# Patient Record
Sex: Male | Born: 1960 | Race: Black or African American | Hispanic: No | Marital: Married | Smoking: Former smoker
Health system: Southern US, Community
[De-identification: ages and names within clinical notes are randomized; demographics above are authoritative.]

## PROBLEM LIST (undated history)

## (undated) DIAGNOSIS — M75102 Unspecified rotator cuff tear or rupture of left shoulder, not specified as traumatic: Secondary | ICD-10-CM

## (undated) DIAGNOSIS — M1990 Unspecified osteoarthritis, unspecified site: Secondary | ICD-10-CM

## (undated) HISTORY — PX: KNEE ARTHROSCOPY: SUR90

## (undated) HISTORY — PX: OTHER SURGICAL HISTORY: SHX169

---

## 1998-05-19 ENCOUNTER — Emergency Department (HOSPITAL_COMMUNITY): Admission: EM | Admit: 1998-05-19 | Discharge: 1998-05-19 | Payer: Self-pay | Admitting: Emergency Medicine

## 2000-10-26 ENCOUNTER — Emergency Department (HOSPITAL_COMMUNITY): Admission: EM | Admit: 2000-10-26 | Discharge: 2000-10-26 | Payer: Self-pay | Admitting: Emergency Medicine

## 2000-10-26 ENCOUNTER — Encounter: Payer: Self-pay | Admitting: Emergency Medicine

## 2003-11-04 ENCOUNTER — Ambulatory Visit (HOSPITAL_COMMUNITY): Admission: RE | Admit: 2003-11-04 | Discharge: 2003-11-04 | Payer: Self-pay | Admitting: Internal Medicine

## 2003-11-09 ENCOUNTER — Ambulatory Visit (HOSPITAL_COMMUNITY): Admission: RE | Admit: 2003-11-09 | Discharge: 2003-11-09 | Payer: Self-pay | Admitting: Internal Medicine

## 2003-12-13 ENCOUNTER — Ambulatory Visit (HOSPITAL_COMMUNITY): Admission: RE | Admit: 2003-12-13 | Discharge: 2003-12-13 | Payer: Self-pay | Admitting: Internal Medicine

## 2004-12-17 ENCOUNTER — Ambulatory Visit (HOSPITAL_COMMUNITY): Admission: RE | Admit: 2004-12-17 | Discharge: 2004-12-17 | Payer: Self-pay | Admitting: Internal Medicine

## 2005-01-09 ENCOUNTER — Ambulatory Visit: Payer: Self-pay | Admitting: Orthopedic Surgery

## 2005-01-21 ENCOUNTER — Ambulatory Visit: Payer: Self-pay | Admitting: Orthopedic Surgery

## 2005-02-01 ENCOUNTER — Ambulatory Visit: Payer: Self-pay | Admitting: Orthopedic Surgery

## 2005-02-02 ENCOUNTER — Inpatient Hospital Stay (HOSPITAL_COMMUNITY): Admission: RE | Admit: 2005-02-02 | Discharge: 2005-02-03 | Payer: Self-pay | Admitting: Orthopedic Surgery

## 2005-02-02 ENCOUNTER — Encounter: Payer: Self-pay | Admitting: Orthopedic Surgery

## 2005-02-07 ENCOUNTER — Ambulatory Visit: Payer: Self-pay | Admitting: Orthopedic Surgery

## 2005-02-07 ENCOUNTER — Ambulatory Visit (HOSPITAL_COMMUNITY): Admission: RE | Admit: 2005-02-07 | Discharge: 2005-02-07 | Payer: Self-pay | Admitting: Urology

## 2005-02-11 ENCOUNTER — Ambulatory Visit: Payer: Self-pay | Admitting: Orthopedic Surgery

## 2005-02-19 ENCOUNTER — Encounter (HOSPITAL_COMMUNITY): Admission: RE | Admit: 2005-02-19 | Discharge: 2005-03-02 | Payer: Self-pay | Admitting: Orthopedic Surgery

## 2005-02-25 ENCOUNTER — Ambulatory Visit: Payer: Self-pay | Admitting: Orthopedic Surgery

## 2005-03-04 ENCOUNTER — Encounter (HOSPITAL_COMMUNITY): Admission: RE | Admit: 2005-03-04 | Discharge: 2005-04-03 | Payer: Self-pay | Admitting: Orthopedic Surgery

## 2005-03-27 ENCOUNTER — Ambulatory Visit: Payer: Self-pay | Admitting: Orthopedic Surgery

## 2005-04-09 ENCOUNTER — Encounter (HOSPITAL_COMMUNITY): Admission: RE | Admit: 2005-04-09 | Discharge: 2005-05-09 | Payer: Self-pay | Admitting: Orthopedic Surgery

## 2005-05-02 ENCOUNTER — Ambulatory Visit: Payer: Self-pay | Admitting: Orthopedic Surgery

## 2005-06-13 ENCOUNTER — Ambulatory Visit: Payer: Self-pay | Admitting: Orthopedic Surgery

## 2005-08-22 ENCOUNTER — Ambulatory Visit: Payer: Self-pay | Admitting: Orthopedic Surgery

## 2005-11-04 ENCOUNTER — Emergency Department (HOSPITAL_COMMUNITY): Admission: EM | Admit: 2005-11-04 | Discharge: 2005-11-04 | Payer: Self-pay | Admitting: Emergency Medicine

## 2005-12-16 ENCOUNTER — Ambulatory Visit: Payer: Self-pay | Admitting: Gastroenterology

## 2005-12-27 ENCOUNTER — Ambulatory Visit: Payer: Self-pay | Admitting: Gastroenterology

## 2006-01-22 ENCOUNTER — Ambulatory Visit: Payer: Self-pay | Admitting: Gastroenterology

## 2006-03-06 ENCOUNTER — Ambulatory Visit: Payer: Self-pay | Admitting: Orthopedic Surgery

## 2006-06-15 ENCOUNTER — Emergency Department (HOSPITAL_COMMUNITY): Admission: EM | Admit: 2006-06-15 | Discharge: 2006-06-15 | Payer: Self-pay | Admitting: Emergency Medicine

## 2006-08-04 ENCOUNTER — Ambulatory Visit: Payer: Self-pay | Admitting: Orthopedic Surgery

## 2007-08-13 ENCOUNTER — Encounter: Payer: Self-pay | Admitting: Orthopedic Surgery

## 2007-08-19 ENCOUNTER — Ambulatory Visit: Payer: Self-pay | Admitting: Orthopedic Surgery

## 2007-08-19 DIAGNOSIS — M1710 Unilateral primary osteoarthritis, unspecified knee: Secondary | ICD-10-CM

## 2007-08-28 ENCOUNTER — Ambulatory Visit (HOSPITAL_COMMUNITY): Admission: RE | Admit: 2007-08-28 | Discharge: 2007-08-28 | Payer: Self-pay | Admitting: Urology

## 2007-09-21 ENCOUNTER — Telehealth: Payer: Self-pay | Admitting: Orthopedic Surgery

## 2007-09-21 ENCOUNTER — Ambulatory Visit (HOSPITAL_COMMUNITY): Admission: RE | Admit: 2007-09-21 | Discharge: 2007-09-21 | Payer: Self-pay | Admitting: Urology

## 2007-09-22 ENCOUNTER — Encounter (INDEPENDENT_AMBULATORY_CARE_PROVIDER_SITE_OTHER): Payer: Self-pay | Admitting: Urology

## 2007-09-22 ENCOUNTER — Ambulatory Visit (HOSPITAL_COMMUNITY): Admission: RE | Admit: 2007-09-22 | Discharge: 2007-09-22 | Payer: Self-pay | Admitting: Urology

## 2008-06-06 ENCOUNTER — Encounter: Payer: Self-pay | Admitting: Orthopedic Surgery

## 2009-06-28 ENCOUNTER — Ambulatory Visit: Payer: Self-pay | Admitting: Orthopedic Surgery

## 2009-11-27 ENCOUNTER — Telehealth: Payer: Self-pay | Admitting: Orthopedic Surgery

## 2009-12-21 ENCOUNTER — Ambulatory Visit: Payer: Self-pay | Admitting: Orthopedic Surgery

## 2009-12-25 ENCOUNTER — Emergency Department (HOSPITAL_COMMUNITY): Admission: EM | Admit: 2009-12-25 | Discharge: 2009-12-25 | Payer: Self-pay | Admitting: Emergency Medicine

## 2009-12-28 ENCOUNTER — Ambulatory Visit: Payer: Self-pay | Admitting: Otolaryngology

## 2010-02-21 ENCOUNTER — Emergency Department (HOSPITAL_COMMUNITY): Admission: EM | Admit: 2010-02-21 | Discharge: 2010-02-21 | Payer: Self-pay | Admitting: Emergency Medicine

## 2010-05-10 ENCOUNTER — Ambulatory Visit: Payer: Self-pay | Admitting: Orthopedic Surgery

## 2010-07-03 NOTE — Progress Notes (Signed)
Summary: Rx question  Phone Note Call from Patient   Caller: Patient Summary of Call: Patient came in to make his annual July appt. Asking about switching to [place] in [place] for next refill.  Scheduled for 12/21/09. Please advise patient about new written Rx or what is needed.  Home PH *** [phone] / Cell, avail before work shift 2:30pm [phone] Initial call taken by: Cammie Sickle,  November 27, 2009 1:15 PM  Follow-up for Phone Call        that should be fine Follow-up by: Ether Griffins,  November 27, 2009 1:23 PM  Additional Follow-up for Phone Call Additional follow up Details #1::        He was asking if he may have a new Rx written prior to appt in July? Additional Follow-up by: Cammie Sickle,  November 27, 2009 1:51 PM    Additional Follow-up for Phone Call Additional follow up Details #2::    no on 11/01/09 he received number 60 with 5 refills, this is enough for 60 days Follow-up by: Ether Griffins,  November 27, 2009 1:57 PM  Additional Follow-up for Phone Call Additional follow up Details #3:: Details for Additional Follow-up Action Taken: Called patient and left voice mail message for patient to return call.   **As of 11/29/09 4:40pm, no return call back from patient. Additional Follow-up by: Cammie Sickle,  November 27, 2009 2:03 PM

## 2010-07-03 NOTE — Assessment & Plan Note (Signed)
Summary: LT KNEE RE-CK/XRAYS/[facility]/CAF   Visit Type:  Follow-up  CC:  bilateral knee .  History of Present Illness: 50 year old male status post high tibial osteotomy the LEFT knee about 5 years ago continues to have pain in both knees.  He declined injection last visit and his LEFT knee  Complains of bilateral knee pain today RIGHT greater than LEFT with small joint effusion  Current medications are Aleve and Vicodin for pain Xrays today of both knees.  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 no drainage, denies fever    Allergies: No Known Drug Allergies  Physical Exam  Msk:  fairly normal body habitus slightly overweight appearance normal Pulses:  normal perfusion in both lower extremities Extremities:  fortunately has full range of motion in terms of flexion in both knees has a slight flexion contracture the LEFT knee has some crepitance in the patellofemoral joint has medial compartment tenderness and when he stands he has significant varus in both knees Neurologic:  The coordination and sensation were normal  The reflexes were normal   Skin:  intact without lesions or rashes Psych:  alert and cooperative; normal mood and affect; normal attention span and concentration   Impression & Recommendations:  Problem # 1:  LOWER LEG, ARTHRITIS, DEGEN./OSTEO (ICD-715.96)  bilateral knee films bilateral varus osteoarthritis with bone-on-bone changes there is a plate in the LEFT tibia from a high tibial osteotomy  We injected the RIGHT knee joint after aspirating 20 cc of clear yellow fluid there were some cartilage particles in it  inject right  Verbal consent was obtained. The knee was prepped with alcohol and ethyl chloride. 1 cc of depomedrol 40mg /cc and 4 cc of lidocaine 1% was injected. there were no complications.   His updated medication list for this problem includes:    Vicodin 5-500 Mg Tabs  (Hydrocodone-acetaminophen) .Marland Kitchen... 1 by mouth q 4 as needed pain  Orders: Est. Patient Level III (16109) Knee x-ray,  3 views (60454) Joint Aspirate / Injection, Large (20610) Depo- Medrol 40mg  (J1030)  Patient Instructions: 1)  Continue vicodin and aleve  2)  You have received an injection of cortisone today. You may experience increased pain at the injection site. Apply ice pack to the area for 20 minutes every 2 hours and take 2 xtra strength tylenol every 8 hours. This increased pain will usually resolve in 24 hours. The injection will take effect in 3-10 days.  3)  return in 6 months for knee films  Prescriptions: VICODIN 5-500 MG TABS (HYDROCODONE-ACETAMINOPHEN) 1 by mouth q 4 as needed pain  #90 x 5   Entered and Authorized by:   [name] MD   Signed by:   [name] MD on 12/21/2009   Method used:   Print then Give to Patient   RxID:   [id number] VICODIN 5/500 one tablet every 4 hrs as needed pain  #60 x 2   Entered and Authorized by:   [name] MD   Signed by:   [name] MD on 12/21/2009   Method used:   Print then Give to Patient   RxID:   [id number]

## 2010-07-03 NOTE — Assessment & Plan Note (Signed)
Summary: med refill/[employer] ins/bsf   Visit Type:  Follow-up  CC:  bilateral knee pain.  History of Present Illness: Mr. Fread had a LEFT proximal tibial osteotomy in September of 2006 for varus knee with pain.  He is now 50 years old.  He takes Vicodin as needed for medial knee pain.  His major problem is pain when the weather is cold and has been quite cold lately.  He did report some stiffness but no injuries.  He wears a knee brace on his LEFT knee as needed and he signed have some pain in the RIGHT knee as well.  He denies catching locking or giving way.  He does note that his activity level has decreased and has put on some weight.  review of systems continued he has put on weight as stated no fever chills or fatigue, no shortness of breath, no feelings of instability or feet related to the knees, denies unsteady gait numbness or tingling.       Allergies (verified): No Known Drug Allergies  Past History:  Past Medical History: Last updated: 08/18/2007 none  Family History: no major medical problems related to his family history  Social History: Patient is married.   Physical Exam  Additional Exam:  Gen. appearance was normal he is slightly overweight.  He is oriented x3  His mood and affect are present  His gait is normal his station is varus in both knees which is moderate.  The RIGHT and LEFT lower extremities exhibited varus malalignment medial joint line tenderness no knee effusions  Range of motion is 120 with a slight loss of extension on the LEFT normal on the RIGHT.  Both knees are stable and exhibit normal strength.  The LEFT knee skin incision is curvilinear nontender without redness the skin on the RIGHT knee is normal  Cardiovascular exam is normal in terms of pulse and temperature without edema in his lower extremities and there is no lymphadenopathy in either groin sensation is normal in both knees reflexes are normal in both knees and ankles  his coordination and balance are normal     Impression & Recommendations:  Problem # 1:  LOWER LEG, ARTHRITIS, DEGEN./OSTEO (ICD-715.96) Assessment Deteriorated  3 views are taken of both knees  Plate and screw construct seen on the LEFT knee with varus malalignment joint space narrowing severe  Impression LEFT knee status post proximal tibial osteotomy with internal fixation and severe medial joint space narrowing  RIGHT knee shows severe medial joint space narrowing, varus deformity, osteophytes  Impression osteoarthritis RIGHT knee severe  Assessment I would say that the LEFT knee proximal tibial osteotomy was successful up at least 4-1/2 years.  He has put on some extra weight which will affect his knees.  But for now I think we can manage him with an injection but he declined.  He is having some numbness and tingling in his hands which we have recommended vitamin B6 twice a day 100 mg for presumed carpal tunnel.  He will take Aleve or Advil once in the morning once at night and continue his Vicodin as needed.  I expect he will improve when the weather turns better.  His updated medication list for this problem includes:    Vicodin 5-500 Mg Tabs (Hydrocodone-acetaminophen) .Marland Kitchen... 1 by mouth q 4 as needed pain  Orders: Est. Patient Level IV (16109) Knee x-ray,  3 views (60454)  Medications Added to Medication List This Visit: 1)  Vicodin 5-500 Mg Tabs (Hydrocodone-acetaminophen) .Marland KitchenMarland KitchenMarland Kitchen  1 by mouth q 4 as needed pain  Patient Instructions: 1)  Take aleve or advil once in the morning and once at night and vicodin as needed  2)  return in 6 months for bilateral x-rays knee  3)  VITAMIN B 6 100 MG two times a day X 6 WEEKS  Prescriptions: VICODIN 5-500 MG TABS (HYDROCODONE-ACETAMINOPHEN) 1 by mouth q 4 as needed pain  #60 x 5   Entered and Authorized by:   [name] MD   Signed by:   [name] MD on 06/28/2009   Method used:   Print then Give to Patient   RxID:    [id number]

## 2010-08-16 LAB — URINALYSIS, ROUTINE W REFLEX MICROSCOPIC
Bilirubin Urine: NEGATIVE
Ketones, ur: NEGATIVE mg/dL
Protein, ur: NEGATIVE mg/dL
pH: 6 (ref 5.0–8.0)

## 2010-08-18 LAB — COMPREHENSIVE METABOLIC PANEL
AST: 20 U/L (ref 0–37)
Albumin: 3.9 g/dL (ref 3.5–5.2)
Alkaline Phosphatase: 54 U/L (ref 39–117)
BUN: 13 mg/dL (ref 6–23)
CO2: 27 mEq/L (ref 19–32)
Creatinine, Ser: 0.72 mg/dL (ref 0.4–1.5)
GFR calc Af Amer: >60 mL/min (ref >60)
GFR calc non Af Amer: >60 mL/min (ref >60)
Sodium: 137 mEq/L (ref 135–145)
Total Bilirubin: 1.5 mg/dL — ABNORMAL HIGH (ref 0.3–1.2)
Total Protein: 7.3 g/dL (ref 6.0–8.3)

## 2010-08-18 LAB — DIFFERENTIAL
Basophils Absolute: 0 10*3/uL (ref 0.0–0.1)
Basophils Relative: 0 % (ref 0–1)
Lymphocytes Relative: 19 % (ref 12–46)
Lymphs Abs: 1.7 10*3/uL (ref 0.7–4.0)
Monocytes Absolute: 0.7 10*3/uL (ref 0.1–1.0)
Neutro Abs: 6.5 10*3/uL (ref 1.7–7.7)
Neutrophils Relative %: 73 % (ref 43–77)

## 2010-08-18 LAB — CBC
MCH: 23.1 pg — ABNORMAL LOW (ref 26.0–34.0)
MCHC: 31.9 g/dL (ref 30.0–36.0)

## 2010-09-06 ENCOUNTER — Other Ambulatory Visit: Payer: Self-pay | Admitting: *Deleted

## 2010-09-06 DIAGNOSIS — M25569 Pain in unspecified knee: Secondary | ICD-10-CM

## 2010-09-06 MED ORDER — HYDROCODONE-ACETAMINOPHEN 5-500 MG PO TABS: 1.0000 | ORAL_TABLET | ORAL | Status: DC | PRN

## 2010-09-17 ENCOUNTER — Encounter: Payer: Self-pay | Admitting: Orthopedic Surgery

## 2010-09-19 ENCOUNTER — Ambulatory Visit (INDEPENDENT_AMBULATORY_CARE_PROVIDER_SITE_OTHER): Payer: BC Managed Care – PPO | Admitting: Orthopedic Surgery

## 2010-09-19 DIAGNOSIS — S43206A Unspecified dislocation of unspecified sternoclavicular joint, initial encounter: Secondary | ICD-10-CM

## 2010-09-19 DIAGNOSIS — S2329XA Dislocation of other parts of thorax, initial encounter: Secondary | ICD-10-CM

## 2010-09-19 NOTE — Patient Instructions (Signed)
You have received a steroid shot. 15% of patients experience increased pain at the injection site with in the next 24 hours. This is best treated with ice and tylenol extra strength 2 tabs every 8 hours. If you are still having pain please call the office.    

## 2010-10-04 ENCOUNTER — Other Ambulatory Visit: Payer: Self-pay | Admitting: *Deleted

## 2010-10-04 ENCOUNTER — Encounter: Payer: Self-pay | Admitting: Orthopedic Surgery

## 2010-10-04 ENCOUNTER — Telehealth: Payer: Self-pay | Admitting: Radiology

## 2010-10-04 DIAGNOSIS — M25569 Pain in unspecified knee: Secondary | ICD-10-CM

## 2010-10-04 MED ORDER — HYDROCODONE-ACETAMINOPHEN 5-500 MG PO TABS: 1.0000 | ORAL_TABLET | ORAL | Status: DC | PRN

## 2010-10-04 NOTE — Progress Notes (Signed)
Followup visit for chronic knee pain and arthritis status post tibial osteotomy just with increasing pain  X-rays taken today injection will be given today as well  Secondary complaint review of systems complains of subluxation of his collarbone as it attaches to the sternum is a sternoclavicular anterior or posterior subluxation dislocation.  Tenderness noted over the LEFT sternoclavicular joint.  There is also pain with palpation there is definite motion there.  He complains of difficulty swallowing when he is lying down  We injected his operative knee  X-rays were taken shows varus deformity osteoarthritis  Plan CT scan a.c. joint

## 2010-10-04 NOTE — Telephone Encounter (Signed)
Read above ???? 

## 2010-10-05 NOTE — Telephone Encounter (Signed)
They would not approve his CT scan. They wanted you to call and do a peer to peer review at [phone]. Opt #2.

## 2010-10-05 NOTE — Telephone Encounter (Signed)
Read above ????

## 2010-10-08 NOTE — Telephone Encounter (Signed)
Forward to Dr. Romeo Apple for response.

## 2010-10-08 NOTE — Telephone Encounter (Signed)
He wants to know what you are going to do to find out what it is? Where does he go from here?

## 2010-10-08 NOTE — Telephone Encounter (Signed)
Tell the patient the study was denied

## 2010-10-09 NOTE — Telephone Encounter (Signed)
I cant do anything about it

## 2010-10-16 NOTE — Op Note (Signed)
NAME:  Henry Wolfe, Henry Wolfe                ACCOUNT NO.:  [id number]   MEDICAL RECORD NO.:  [id number]          PATIENT TYPE:  AMB   LOCATION:  DAY                           FACILITY:  [facility]   PHYSICIAN:  Ky Barban, M.D.DATE OF BIRTH:  04/10/1961   DATE OF PROCEDURE:  DATE OF DISCHARGE:                               OPERATIVE REPORT   PREOPERATIVE DIAGNOSES:  1. Microscopic hematuria.  2. Lipoma, right spermatic cord.   PROCEDURES:  1. Cystoscopy.  2. Biopsy of prostatic urethra and fulguration.  3. Excision of right spermatic cord lipoma.   ANESTHESIA:  General endotracheal.   PROCEDURE:  The patient under general endotracheal anesthesia in  lithotomy position, after usual prep and drape, a #25 cystoscope was  introduced under direct vision.  His meatus was tight.  I had to dilate  him to get the cystoscope in.  The prostatic urethra shows chronic  inflammatory changes especially around the verumontanum, and the bladder  was inspected.  There was no tumor, stone, or foreign body.  Then, using  a flexible biopsy forceps, biopsy around the verumontanum was taken.  Then, this area around the verumontanum was fulgurated with Bugbee  electrode.  These instruments were removed.  The patient was placed in  the supine position, then prepped and draped again.  Lipoma was located  in the upper part of the scrotum near the neck of the scrotum.  It was  held by the assistant, tied, and longitudinal incision about an inch  long is made.  Fascial layer divided with the help of the cautery.  The  lipoma is delivered through this incision.  The capsule around the  lipoma was opened up.  The lipoma came out with blunt dissection  completely.  Then, the inside of the capsule was inspected.  The  bleeders were coagulated.  The capsule along with the fascial layers was  closed with a running stitch of 3-0 Vicryl.  Skin was closed with 4-0  chromic horizontal mattress stitches.  At the end, sterile  gauze  dressing applied.  The patient left the operating room in a satisfactory  condition.      Ky Barban, M.D.  Electronically Signed     MIJ/MEDQ  D:  09/22/2007  T:  09/23/2007  Job:  [id number]

## 2010-10-16 NOTE — H&P (Signed)
NAME:  Henry Wolfe, Henry Wolfe                ACCOUNT NO.:  [id number]   MEDICAL RECORD NO.:  [id number]          PATIENT TYPE:  AMB   LOCATION:  DAY                           FACILITY:  [facility]   PHYSICIAN:  Ky Barban, M.D.DATE OF BIRTH:  Feb 27, 1961   DATE OF ADMISSION:  09/22/2007  DATE OF DISCHARGE:  LH                              HISTORY & PHYSICAL   CHIEF COMPLAINT:  Microscopic hematuria and right hydrocele.   HISTORY:  The patient was referred to me because of persistent  microscopic hematuria.  Our workup in the office with CT scan of abdomen  and pelvis with and without contrast is negative.  Urine cytology is  negative.  He is going to have cystoscopy tomorrow at the time of his  hydrocelectomy.  He was also found to have swelling of his right  hemiscrotum.  He requested that he wanted to go ahead and have that  fixed because it was bothering him.   PAST MEDICAL HISTORY:  1. History of erectile dysfunction in the past.  2. History of having problem with his left knee.  3. No history of diabetes or hypertension.  4. He had microscopic hematuria even when I saw him in 2006, but he      never really had a complete workup.  So, I will do his      hydrocelectomy, right and also cystoscopy tomorrow at the same      time.   REVIEW OF SYSTEMS:  Unremarkable.   FAMILY HISTORY:  Negative.   PERSONAL HISTORY:  He does not smoke or drink.   PHYSICAL EXAMINATION:  Blood pressure 150/80 and temperature is normal.  CENTRAL NERVOUS SYSTEM:  No gross neurological deficit.  HEAD, NECK, EYE, ENT:  Negative.  CHEST:  Symmetrical.  HEART:  Regular sinus rhythm.  No murmur.  ABDOMEN:  Soft and flat.  Liver, spleen, and kidneys are not palpable.  No CVA tenderness.  External genitalia is unremarkable.  RECTAL:  Prostate 1.5+, smooth, and firm.   IMPRESSION:  Microscopic hematuria and right hydrocele.   Of note, examination of his scrotum shows enlarged right scrotum, which  transilluminates.  He is going to have testicular ultrasound today.      Ky Barban, M.D.  Electronically Signed     MIJ/MEDQ  D:  09/21/2007  T:  09/22/2007  Job:  [id number]

## 2010-10-19 NOTE — Op Note (Signed)
NAME:  Henry Wolfe, Henry Wolfe                ACCOUNT NO.:  [id number]   MEDICAL RECORD NO.:  [id number]          PATIENT TYPE:  INP   LOCATION:  A327                          FACILITY:  [facility]   PHYSICIAN:  Vickki Hearing, M.D.DATE OF BIRTH:  [birth year]   DATE OF PROCEDURE:  02/02/2005  DATE OF DISCHARGE:                                 OPERATIVE REPORT   HISTORY:  This is a 50 year old male with medial compartment arthritis and  varus deformity of the knee presented with significant medial knee pain and  some back pain. Back pain was treated with prednisone pack successfully. He  continued to have a medial knee pain. We discussed the possibility of  osteotomy versus unicompartmental arthroplasty as well as total knee  replacement, and a decision was made jointly to go ahead with an osteotomy.  Primary indication was medial knee pain and varus deformity.   PREOPERATIVE DIAGNOSIS:  Gonarthrosis with varus left knee.   POSTOPERATIVE DIAGNOSIS:  Gonarthrosis with varus left knee.   PROCEDURE:  High tibial osteotomy, left knee.   OPERATIVE FINDINGS:  Medial compartment arthrosis with 5 degrees varus  deformity.   SURGEON:  Dr. Romeo Apple.   ANESTHETIC:  General.   DETAILS OF PROCEDURE:  The patient was identified properly as Henry Wolfe.  His left knee was marked as the surgical site and was countersigned by the  surgeon. History and physical were updated, and he was given a gram of  Ancef.   He was taken to the operating room where a spinal anesthetic was attempted  but was unsuccessful, and he was intubated.   We prepped and draped his left leg with a tourniquet on the left thigh. We  took a time-out and completed that as required to confirm the procedure as a  left knee high tibial osteotomy.   With the tourniquet elevated, a curvilinear incision was made over the  lateral portion of the left knee, extended down the anterior portion of the  tibial shaft. The subcutaneous tissue  was divided in a continuous flap down  to the underlying fascia. Fascia was split in line with the skin incision,  and the tibiofibular joint was released with blunt dissection. Once this was  successfully accomplished, the anterior musculature on the tibia was  subperiosteally dissected until the posterior aspect of the tibia was easily  identified.   We then took two K-wires and found the lateral joint just beneath the  lateral meniscus and confirmed this with x-ray. We placed a cutting jig in  the neutral position and confirmed this with x-ray as well. We then took a  drill bit followed by pin to stabilize the transverse cutting jig. Once we  determined the proper slope of the tibial, we drilled and placed another  smooth pin. Once this was successful, we drilled across the tibia to a depth  of 80 mm and then set the saw for 70 mm. We cut transversely across the  tibia 2.5 cm below the joint line at a 70 mm depth. We removed this jig,  placed the angled cutting jig. The patient  had a 4 degree deformity, and we  opted for an 8 degree valgus alignment postoperatively indicating a 12  degree cut. We made our 12 degrees degree cut and removed the bone wedge and  saved it for later bone graft.   We then placed the lateral plate, drilled both screws, and left them  somewhat loose. We drilled our distal hole for the compression clamp and  applied the compression clamp and jig to the distal plate. We then waited  approximately 8 minutes under compression, and the osteotomy did not close  down. We then took the saw and extended the transverse cut slightly medially  and then manually made the osteotomy correction and closed it down. We  applied our distal screws and one screw across the osteotomy site. We added  autogenous bone graft. We let the tourniquet down, controlled the bleeding,  irrigated the wound and closed in layered fashion with loose approximation  of the fascia with 0 Monocryl,  and then subcu was closed with 2-0 Monocryl,  skin with staples. The patient was placed on Ace bandage and CryoCuff as  well as knee immobilizer. He was extubated in recovery room in stable  condition. Counts were correct. No complications. Blood loss was  approximately 150 cc.   POSTOPERATIVE PLAN:  He will be admitted for observation and pain control.  He will be allowed weightbearing as tolerated with two crutches for  approximately six weeks. We will take his staples out at approximately 10  days. We will make a radiographs at 10 days, 6 weeks, and 12 weeks and then  until healing. He can start range of motion exercises with the therapist or  at home after the staples are removed.      Vickki Hearing, M.D.  Electronically Signed     SEH/MEDQ  D:  02/02/2005  T:  02/02/2005  Job:  [id number]

## 2010-10-19 NOTE — H&P (Signed)
NAME:  Henry Wolfe, Henry Wolfe                ACCOUNT NO.:  [id number]   MEDICAL RECORD NO.:  [id number]          PATIENT TYPE:  AMB   LOCATION:  DAY                           FACILITY:  [facility]   PHYSICIAN:  Vickki Hearing, M.D.DATE OF BIRTH:  [birth year]   DATE OF ADMISSION:  DATE OF DISCHARGE:  LH                                HISTORY & PHYSICAL   CHIEF COMPLAINT:  Left knee pain.   HISTORY:  This is a 50 year old male who complains of intermittent,  moderate, dull, aching medial pain in his left knee for approximately the  last 4-5 years.  There are no modifying factors, in terms of relief.  This  is gradual atraumatic onset of pain.  He complains of some swelling.  He has  worn a knee sleeve in the past.  He has had intermittent episodes of  swelling.  We have x-rayed his knee and found a varus alignment with medial  compartment arthrosis.   He indicates that he has experienced some weight gain and chest pain with  poor circulation, cough, nausea, reflux, dizziness, joint swelling, pain,  and mood swings under his system review.  His urinary, endocrine, skin,  ears, nose, and throat, immunologic, and lymphatic systems are normal.   He has no known drug allergies.   He has no medical problems.  No previous surgery.   His only medication right now is Vicodin.   FAMILY HISTORY:  Negative.   FAMILY PHYSICIAN:  Dr. Felecia Shelling.   SOCIAL HISTORY:  He is married.  He is a Location manager.  He does not  smoke, drink, or use caffeinated beverages.   PHYSICAL EXAMINATION:  VITAL SIGNS:  Height 5 foot 8.  Pulse 76 with a  respiratory rate of 16.  GENERAL APPEARANCE:  He has normal development, grooming, hygiene, and  nutrition.  Body habitus medium.  PSYCHOLOGIC:  He is awake, alert and oriented x3 with normal sensation on  neurologic exam.  CARDIOVASCULAR:  Normal pulses.  No venous stasis.  Normal temperature  without edema.  SKIN:  Normal.  EXTREMITIES:  Left knee shows full range of  motion.  Good strength and  muscle tone.  No instability.  He does have tenderness of the medial  compartment with varus alignment of the knee.  He walks with an antalgic  gait.  His upper extremities show normal range of motion, strength,  stability, and alignment.  His right lower extremity is normal.   He has radiographs taken in my office which show medial compartment  __________ arthrosis.   DIAGNOSIS:  Osteoarthritis.   PLAN:  Osteotomy of the knee.  We have reviewed the risks and benefits of  the procedure.  He agrees to proceed.  He has been offered hemiarthroplasty  as well as versus continued nonoperative treatment.  The pain has become  progressive, and he has opted for surgery.  We will do a high tibial  osteotomy.  He knows he will be out of work for three months with three  months of rehabilitation.      Vickki Hearing, M.D.  Electronically  Signed     SEH/MEDQ  D:  01/31/2005  T:  01/31/2005  Job:  [id number]

## 2010-10-19 NOTE — Assessment & Plan Note (Signed)
Henry Wolfe                           GASTROENTEROLOGY OFFICE NOTE   NAME:DAVISJhamari, Markowicz                       MRN:          [id number]  DATE:12/16/2005                            DOB:          August 05, 1960    PROBLEM:  1.  Rectal bleeding.  2.  Dysphagia.   Henry Wolfe is a 50 year old African-American male referred to [place] courtesy of  Dr. Felecia Shelling for evaluation.  On several occasions he has seen blood mixed with  his stool.  He has passed clots at times.  He denies abdominal or rectal  pain but he may have minimal rectal burning.  He also complains of frequent  pyrosis for which he takes Zantac. He has noted dysphagia to solids.  He has  excess belching and flatulence.   PAST MEDICAL HISTORY:  Pertinent  for sleep apnea.  He has chronic headaches  and has arthritis.   FAMILY HISTORY:  Noncontributory.   MEDICATIONS:  Vicodin and ibuprofen. He has no allergies.  He does not  smoke. He drinks occasionally.  He is married and works as an Journalist, newspaper.   REVIEW OF SYSTEMS:  Reviewed and is positive for frequent cough, joint pain,  sleeping problems and fatigue.   PHYSICAL EXAMINATION:   PHYSICAL EXAMINATION:  VITAL SIGNS:  Pulse is 84, blood pressure 138/82,  weight 247.   HEENT:  EOMI. PERRLA. Sclerae are anicteric.  Conjunctivae are pink.   NECK:  Supple without thyromegaly, adenopathy or carotid bruits.   CHEST:  Clear to auscultation and percussion without adventitious sounds.   CARDIAC:  Regular rhythm; normal S1 S2.  There are no murmurs, gallops or  rubs.   ABDOMEN:  Bowel sounds are normoactive.  Abdomen is soft, non-tender and non-  distended.  There are no abdominal masses, tenderness, splenic enlargement  or hepatomegaly.   EXTREMITIES:  Full range of motion.  No cyanosis, clubbing or edema.   RECTAL:  Deferred.   IMPRESSION:  1.  Limited rectal bleeding--rule out colonic bleeding source including      hemorrhoids, polyps,  AVMs, and neoplasm.  2.  Gastroesophageal reflux disease.  3.  Dysphagia--rule out early esophageal stricture.   RECOMMENDATIONS:  1.  Colonoscopy.  2.  Upper endoscopy with dilatation as indicated.  3.  Begin Protonix 40 mg a day.                                   Barbette Hair. Arlyce Dice, MD, [facility]   RDK/MedQ  DD:  12/16/2005  DT:  12/16/2005  Job #:  [id number]   cc:   Tesfaye D. Felecia Shelling, MD

## 2011-01-17 ENCOUNTER — Other Ambulatory Visit: Payer: Self-pay | Admitting: *Deleted

## 2011-01-17 DIAGNOSIS — M25569 Pain in unspecified knee: Secondary | ICD-10-CM

## 2011-01-17 MED ORDER — HYDROCODONE-ACETAMINOPHEN 5-500 MG PO TABS: 1.0000 | ORAL_TABLET | ORAL | Status: DC | PRN

## 2011-02-25 ENCOUNTER — Other Ambulatory Visit: Payer: Self-pay | Admitting: *Deleted

## 2011-02-25 DIAGNOSIS — M25569 Pain in unspecified knee: Secondary | ICD-10-CM

## 2011-02-25 MED ORDER — HYDROCODONE-ACETAMINOPHEN 5-500 MG PO TABS: 1.0000 | ORAL_TABLET | ORAL | Status: DC | PRN

## 2011-02-26 LAB — CBC
Hemoglobin: 12 — ABNORMAL LOW
WBC: 8.3

## 2011-02-26 LAB — URINALYSIS, ROUTINE W REFLEX MICROSCOPIC
Glucose, UA: NEGATIVE
Protein, ur: NEGATIVE
Urobilinogen, UA: 0.2
pH: 5.5

## 2011-02-26 LAB — BASIC METABOLIC PANEL
CO2: 26
Glucose, Bld: 83
Potassium: 4.3

## 2011-02-26 LAB — URINE MICROSCOPIC-ADD ON

## 2011-02-26 LAB — DIFFERENTIAL
Basophils Absolute: 0
Lymphs Abs: 1.7

## 2011-04-16 ENCOUNTER — Other Ambulatory Visit: Payer: Self-pay | Admitting: *Deleted

## 2011-04-16 DIAGNOSIS — M25569 Pain in unspecified knee: Secondary | ICD-10-CM

## 2011-04-16 MED ORDER — HYDROCODONE-ACETAMINOPHEN 5-500 MG PO TABS: 1.0000 | ORAL_TABLET | ORAL | Status: DC | PRN

## 2011-07-23 ENCOUNTER — Other Ambulatory Visit: Payer: Self-pay | Admitting: *Deleted

## 2011-07-23 DIAGNOSIS — M25569 Pain in unspecified knee: Secondary | ICD-10-CM

## 2011-07-23 MED ORDER — HYDROCODONE-ACETAMINOPHEN 5-500 MG PO TABS: 1.0000 | ORAL_TABLET | ORAL | Status: DC | PRN

## 2011-11-27 ENCOUNTER — Other Ambulatory Visit: Payer: Self-pay | Admitting: *Deleted

## 2011-11-27 DIAGNOSIS — M25569 Pain in unspecified knee: Secondary | ICD-10-CM

## 2011-11-27 MED ORDER — HYDROCODONE-ACETAMINOPHEN 5-500 MG PO TABS: 1.0000 | ORAL_TABLET | ORAL | Status: DC | PRN

## 2012-04-13 ENCOUNTER — Other Ambulatory Visit: Payer: Self-pay | Admitting: Orthopedic Surgery

## 2012-04-13 DIAGNOSIS — M1710 Unilateral primary osteoarthritis, unspecified knee: Secondary | ICD-10-CM

## 2012-04-13 MED ORDER — HYDROCODONE-ACETAMINOPHEN 5-325 MG PO TABS: 1.0000 | ORAL_TABLET | ORAL | Status: DC | PRN

## 2012-08-12 ENCOUNTER — Emergency Department (HOSPITAL_COMMUNITY)
Admission: EM | Admit: 2012-08-12 | Discharge: 2012-08-12 | Disposition: A | Discharge status: Home / Self Care | Payer: 59 | Attending: Emergency Medicine | Admitting: Emergency Medicine

## 2012-08-12 ENCOUNTER — Encounter (HOSPITAL_COMMUNITY): Payer: Self-pay

## 2012-08-12 DIAGNOSIS — K0889 Other specified disorders of teeth and supporting structures: Secondary | ICD-10-CM

## 2012-08-12 DIAGNOSIS — K089 Disorder of teeth and supporting structures, unspecified: Secondary | ICD-10-CM | POA: Insufficient documentation

## 2012-08-12 DIAGNOSIS — R6884 Jaw pain: Secondary | ICD-10-CM | POA: Insufficient documentation

## 2012-08-12 MED ORDER — HYDROCODONE-ACETAMINOPHEN 5-325 MG PO TABS: 2.0000 | ORAL_TABLET | ORAL | Status: DC | PRN

## 2012-08-12 MED ORDER — AMOXICILLIN 500 MG PO CAPS: 500.0000 mg | ORAL_CAPSULE | Freq: Three times a day (TID) | ORAL | Status: DC

## 2012-08-12 MED ORDER — MELOXICAM 7.5 MG PO TABS: ORAL_TABLET | ORAL | Status: DC

## 2012-08-12 NOTE — ED Notes (Signed)
Pt c/o left side toothache for 2 months, pain worse today

## 2012-08-12 NOTE — ED Provider Notes (Signed)
History     CSN: [id number]  Arrival date & time 08/12/12  1249   First MD Initiated Contact with Patient 08/12/12 1310      Chief Complaint  Patient presents with  . Dental Pain    (Consider location/radiation/quality/duration/timing/severity/associated sxs/prior treatment) Patient is a 52 y.o. male presenting with tooth pain. The history is provided by the patient.  Dental PainThe primary symptoms include mouth pain. Primary symptoms do not include shortness of breath or cough. The symptoms began more than 1 month ago. The symptoms are unchanged. The symptoms occur intermittently.  Additional symptoms include: gum swelling and jaw pain. Additional symptoms do not include: trouble swallowing and nosebleeds. Medical issues do not include: alcohol problem, smoking and chewing tobacco.    History reviewed. No pertinent past medical history.  Past Surgical History  Procedure Laterality Date  . Right tibial osteotomy      No family history on file.  History  Substance Use Topics  . Smoking status: Never Smoker   . Smokeless tobacco: Not on file  . Alcohol Use: No      Review of Systems  Constitutional: Negative for activity change.       All ROS Neg except as noted in HPI  HENT: Positive for dental problem. Negative for nosebleeds, trouble swallowing and neck pain.   Eyes: Negative for photophobia and discharge.  Respiratory: Negative for cough, shortness of breath and wheezing.   Cardiovascular: Negative for chest pain and palpitations.  Gastrointestinal: Negative for abdominal pain and blood in stool.  Genitourinary: Negative for dysuria, frequency and hematuria.  Musculoskeletal: Negative for back pain and arthralgias.  Skin: Negative.   Neurological: Negative for dizziness, seizures and speech difficulty.  Psychiatric/Behavioral: Negative for hallucinations and confusion.    Allergies  Review of patient's allergies indicates no known allergies.  Home  Medications   Current Outpatient Rx  Name  Route  Sig  Dispense  Refill  . naproxen sodium (ANAPROX) 220 MG tablet   Oral   Take 220-440 mg by mouth daily as needed (pain).            BP 140/78  Pulse 75  Temp(Src) 98.1 F (36.7 C) (Oral)  Resp 18  Ht 5' 8.75" (1.746 m)  Wt 208 lb (94.348 kg)  BMI 30.95 kg/m2  SpO2 99%  Physical Exam  Nursing note and vitals reviewed. Constitutional: He is oriented to person, place, and time. He appears well-developed and well-nourished.  Non-toxic appearance.  HENT:  Head: Normocephalic.  Right Ear: Tympanic membrane and external ear normal.  Left Ear: Tympanic membrane and external ear normal.  Swelling of the left upper gums. No visible abscess. Cavities of the upper molars. Airway patent. No swelling under the tongue. Face symmetrical.  Eyes: EOM and lids are normal. Pupils are equal, round, and reactive to light.  Neck: Normal range of motion. Neck supple. Carotid bruit is not present.  Cardiovascular: Normal rate, regular rhythm, normal heart sounds, intact distal pulses and normal pulses.   Pulmonary/Chest: Breath sounds normal. No respiratory distress.  Abdominal: Soft. Bowel sounds are normal. There is no tenderness. There is no guarding.  Musculoskeletal: Normal range of motion.  Lymphadenopathy:       Head (right side): No submandibular adenopathy present.       Head (left side): No submandibular adenopathy present.    He has no cervical adenopathy.  Neurological: He is alert and oriented to person, place, and time. He has normal strength. No cranial nerve  deficit or sensory deficit.  Skin: Skin is warm and dry.  Psychiatric: He has a normal mood and affect. His speech is normal.    ED Course  Procedures (including critical care time)  Labs Reviewed - No data to display No results found.   No diagnosis found.    MDM  I have reviewed nursing notes, vital signs, and all appropriate lab and imaging results for this  patient. Pt presents to ED with 1-2 months of left jaw pain. Hx of cavities. No report of high fever.Dental resource guide given to the patient. Rx for amoxil, mobic and norco given to the patient.       Kathie Dike, PA-C 08/13/12 [phone]

## 2012-08-12 NOTE — ED Notes (Signed)
Pt c/o toothache in left jaw for past couple of months.

## 2012-08-13 NOTE — ED Provider Notes (Signed)
Medical screening examination/treatment/procedure(s) were performed by non-physician practitioner and as supervising physician I was immediately available for consultation/collaboration.   Joya Gaskins, MD 08/13/12 [phone]

## 2012-10-20 ENCOUNTER — Other Ambulatory Visit: Payer: Self-pay | Admitting: *Deleted

## 2012-10-20 DIAGNOSIS — M25562 Pain in left knee: Secondary | ICD-10-CM

## 2012-10-20 MED ORDER — HYDROCODONE-ACETAMINOPHEN 5-325 MG PO TABS: 2.0000 | ORAL_TABLET | ORAL | Status: DC | PRN

## 2012-11-23 ENCOUNTER — Other Ambulatory Visit: Payer: Self-pay | Admitting: *Deleted

## 2012-11-23 DIAGNOSIS — M25561 Pain in right knee: Secondary | ICD-10-CM

## 2012-11-23 MED ORDER — HYDROCODONE-ACETAMINOPHEN 5-325 MG PO TABS: 2.0000 | ORAL_TABLET | ORAL | Status: DC | PRN

## 2012-12-28 ENCOUNTER — Other Ambulatory Visit: Payer: Self-pay | Admitting: *Deleted

## 2012-12-28 DIAGNOSIS — M25562 Pain in left knee: Secondary | ICD-10-CM

## 2012-12-28 MED ORDER — HYDROCODONE-ACETAMINOPHEN 5-325 MG PO TABS: 2.0000 | ORAL_TABLET | ORAL | Status: DC | PRN

## 2013-01-26 ENCOUNTER — Other Ambulatory Visit: Payer: Self-pay | Admitting: *Deleted

## 2013-01-26 DIAGNOSIS — M25561 Pain in right knee: Secondary | ICD-10-CM

## 2013-01-26 MED ORDER — HYDROCODONE-ACETAMINOPHEN 5-325 MG PO TABS: 1.0000 | ORAL_TABLET | ORAL | Status: DC | PRN

## 2013-02-15 ENCOUNTER — Telehealth: Payer: Self-pay | Admitting: Orthopedic Surgery

## 2013-02-16 NOTE — Telephone Encounter (Signed)
Tried to return call to patient and the number that is in the system is the wrong number.

## 2013-02-22 ENCOUNTER — Other Ambulatory Visit: Payer: Self-pay | Admitting: *Deleted

## 2013-02-22 DIAGNOSIS — M25561 Pain in right knee: Secondary | ICD-10-CM

## 2013-02-22 MED ORDER — HYDROCODONE-ACETAMINOPHEN 5-325 MG PO TABS: 1.0000 | ORAL_TABLET | ORAL | Status: DC | PRN

## 2013-03-02 ENCOUNTER — Encounter: Payer: Self-pay | Admitting: Orthopedic Surgery

## 2013-03-02 ENCOUNTER — Ambulatory Visit (INDEPENDENT_AMBULATORY_CARE_PROVIDER_SITE_OTHER): Payer: 59 | Admitting: Orthopedic Surgery

## 2013-03-02 ENCOUNTER — Ambulatory Visit (INDEPENDENT_AMBULATORY_CARE_PROVIDER_SITE_OTHER): Payer: 59

## 2013-03-02 VITALS — BP 120/78 | Ht 68.5 in | Wt 215.0 lb

## 2013-03-02 DIAGNOSIS — M25569 Pain in unspecified knee: Secondary | ICD-10-CM

## 2013-03-02 DIAGNOSIS — M25561 Pain in right knee: Secondary | ICD-10-CM

## 2013-03-02 DIAGNOSIS — M1711 Unilateral primary osteoarthritis, right knee: Secondary | ICD-10-CM | POA: Insufficient documentation

## 2013-03-02 DIAGNOSIS — M1710 Unilateral primary osteoarthritis, unspecified knee: Secondary | ICD-10-CM

## 2013-03-02 MED ORDER — HYDROCODONE-ACETAMINOPHEN 5-325 MG PO TABS: 1.0000 | ORAL_TABLET | ORAL | Status: DC | PRN

## 2013-03-02 NOTE — Progress Notes (Signed)
Subjective:     Patient ID: Henry Wolfe, male   DOB: Feb 21, 1961, 52 y.o.   MRN: [id number]  Chief Complaint  Patient presents with  . Knee Pain    Right knee pain, no injury    Knee Pain    right knee pain medial side associated with effusion history of previous aspiration injection, status post left high tibial osteotomy. Presents with severe pain after ambulation and work. Currently working 12 hour shift  Currently on hydrocodone 5 mg.   Review of Systems Neck pain stiffness and some upper arm discomfort with heavy lifting    Objective:   Physical Exam BP 120/78  Ht 5' 8.5" (1.74 m)  Wt 215 lb (97.523 kg)  BMI 32.21 kg/m2 General appearance is normal, the patient is alert and oriented x3 with normal mood and affect. The medial joint line is tender he has a varus deformity of the right knee flexion is 125 full extension knee is stable    Assessment:     X-ray varus osteoarthritis grade 4  Osteoarthritis right knee with effusion    Plan:     Aspiration injection right knee  Continue hydrocodone  Followup in January preop for right total knee  Procedure Knee  Injection and aspiration Procedure Note  Pre-operative Diagnosis: left knee oa, effusion  Post-operative Diagnosis: same  Indications: pain, swelling  Anesthesia: ethyl chloride   Procedure Details   Verbal consent was obtained for the procedure. Time out was completed.The joint was prepped with alcohol, followed by  Ethyl chloride spray and The 18-gauge needle was inserted into the joint via lateral approach and we aspirated approximately 10 cc of clear yellow fluid  This was followed by the injection of 4ml 1% lidocaine and 1 ml of depomedrol  was then injected into the joint . The needle was removed and the area cleansed and dressed.  Complications:  None; patient tolerated the procedure well.

## 2013-03-02 NOTE — Patient Instructions (Addendum)
You have received a steroid shot. 15% of patients experience increased pain at the injection site with in the next 24 hours. This is best treated with ice and tylenol extra strength 2 tabs every 8 hours. If you are still having pain please call the office.   Continue Norco for pain   (Note: norco will now be a schedule II drug, you will need a written prescription for each prescription)  Wear and Tear Disorders of the Knee (Arthritis, Osteoarthritis) Everyone will experience wear and tear injuries (arthritis, osteoarthritis) of the knee. These are the changes we all get as we age. They come from the joint stress of daily living. The amount of cartilage damage in your knee and your symptoms determine if you need surgery. Mild problems require approximately two months recovery time. More severe problems take several months to recover. With mild problems, your surgeon may find worn and rough cartilage surfaces. With severe changes, your surgeon may find cartilage that has completely worn away and exposed the bone. Loose bodies of bone and cartilage, bone spurs (excess bone growth), and injuries to the menisci (cushions between the large bones of your leg) are also common. All of these problems can cause pain. For a mild wear and tear problem, rough cartilage may simply need to be shaved and smoothed. For more severe problems with areas of exposed bone, your surgeon may use an instrument for roughing up the bone surfaces to stimulate new cartilage growth. Loose bodies are usually removed. Torn menisci may be trimmed or repaired. ABOUT THE ARTHROSCOPIC PROCEDURE Arthroscopy is a surgical technique. It allows your orthopedic surgeon to diagnose and treat your knee injury with accuracy. The surgeon looks into your knee through a small scope. The scope is like a small (pencil-sized) telescope. Arthroscopy is less invasive than open knee surgery. You can expect a more rapid recovery. After the procedure, you will  be moved to a recovery area until most of the effects of the medication have worn off. Your caregiver will discuss the test results with you. RECOVERY The severity of the arthritis and the type of procedure performed will determine recovery time. Other important factors include age, physical condition, medical conditions, and the type of rehabilitation program. Strengthening your muscles after arthroscopy helps guarantee a better recovery. Follow your caregiver's instructions. Use crutches, rest, elevate, ice, and do knee exercises as instructed. Your caregivers will help you and instruct you with exercises and other physical therapy required to regain your mobility, muscle strength, and functioning following surgery. Only take over-the-counter or prescription medicines for pain, discomfort, or fever as directed by your caregiver.  SEEK MEDICAL CARE IF:   There is increased bleeding (more than a small spot) from the wound.  You notice redness, swelling, or increasing pain in the wound.  Pus is coming from wound.  You develop an unexplained oral temperature above 102 F (38.9 C) , or as your caregiver suggests.  You notice a foul smell coming from the wound or dressing.  You have severe pain with motion of the knee. SEEK IMMEDIATE MEDICAL CARE IF:   You develop a rash.  You have difficulty breathing.  You have any allergic problems. MAKE SURE YOU:   Understand these instructions.  Will watch your condition.  Will get help right away if you are not doing well or get worse. Document Released: 05/17/2000 Document Revised: 08/12/2011 Document Reviewed: 10/14/2007 [facility] Patient Information 2014 [place], [place].   Total Knee Replacement Total knee replacement is a  procedure to replace your knee joint with an artificial knee joint (prosthetic knee joint). The purpose of this surgery is to reduce pain and improve your knee function. LET YOUR CAREGIVER KNOW ABOUT:   Any allergies you  have.  Any medicines you are taking, including vitamins, herbs, eyedrops, over-the-counter medicines, and creams.  Any problems you have had with the use of anesthetics.  Family history of problems with the use of anesthetics.  Any blood disorders you have, including bleeding problems or clotting problems.  Previous surgeries you have had. RISKS AND COMPLICATIONS  Generally, total knee replacement is a safe procedure. However, as with any surgical procedure, complications can occur. Possible complications associated with total knee replacement include:  Loss of range of motion of the knee or instability.  Loosening of the prosthesis.  Infection.  Persistent pain. BEFORE THE PROCEDURE   Your caregiver will instruct you when you need to stop eating and drinking.  Ask your caregiver if you need to change or stop any regular medicines. PROCEDURE  Just before the procedure you will receive medicine that will make you drowsy (sedative). This will be given through a tube that is inserted into one of your veins (intravenous [IV] tube). Then you will either receive medicine to block pain from the waist down through your legs (spinal block) or medicine to also receive medicine to make you fall asleep (general anesthetic). You may also receive medicine to block feeling in your leg (nerve block) to help ease pain after surgery. An incision will be made in your knee. Your surgeon will take out any damaged cartilage and bone by sawing off the damaged surfaces. Then the surgeon will put a new metal liner over the sawed off portion of your thigh bone (femur) and a plastic liner over the sawed off portion of one of the bones of your lower leg (tibia). This is to restore alignment and function to your knee. A plastic piece is often used to restore the surface of your knee cap. AFTER THE PROCEDURE  You will be taken to the recovery area. You may have drainage tubes to drain excess fluid from your knee.  These tubes attach to a device that removes these fluids. Once you are awake, stable, and taking fluids well, you will be taken to your hospital room. You will receive physical therapy as prescribed by your caregiver. The length of your stay in the hospital after a knee replacement is 2 4 days. Your surgeon may recommend that you spend time (usually an additional 10 14 days) in an extended-care facility to help you begin walking again and improve your range of motion before you go home. You may also be prescribed blood-thinning medicine to decrease your risk of developing blood clots in your leg. Document Released: 08/26/2000 Document Revised: 11/19/2011 Document Reviewed: 06/30/2011 [facility] Patient Information 2014 [place], [place].

## 2013-04-12 ENCOUNTER — Telehealth: Payer: Self-pay | Admitting: Orthopedic Surgery

## 2013-04-12 NOTE — Telephone Encounter (Signed)
Henry Wolfe wants a prescription for Hydrocodone  His # [phone]

## 2013-04-13 ENCOUNTER — Other Ambulatory Visit: Payer: Self-pay | Admitting: Orthopedic Surgery

## 2013-04-13 DIAGNOSIS — M25561 Pain in right knee: Secondary | ICD-10-CM

## 2013-04-13 MED ORDER — HYDROCODONE-ACETAMINOPHEN 5-325 MG PO TABS: 1.0000 | ORAL_TABLET | ORAL | Status: DC | PRN

## 2013-04-13 NOTE — Telephone Encounter (Signed)
ready

## 2013-04-13 NOTE — Telephone Encounter (Signed)
Advised patient ready to pick up.

## 2013-04-13 NOTE — Telephone Encounter (Signed)
Patient picked up the prescription.

## 2013-05-19 ENCOUNTER — Telehealth: Payer: Self-pay | Admitting: Orthopedic Surgery

## 2013-05-19 NOTE — Telephone Encounter (Signed)
Salbador Fiveash wants a prescription for Hydrocodone  His # [phone]

## 2013-05-20 ENCOUNTER — Other Ambulatory Visit: Payer: Self-pay | Admitting: Orthopedic Surgery

## 2013-05-20 DIAGNOSIS — M25561 Pain in right knee: Secondary | ICD-10-CM

## 2013-05-20 MED ORDER — HYDROCODONE-ACETAMINOPHEN 5-325 MG PO TABS: 1.0000 | ORAL_TABLET | ORAL | Status: DC | PRN

## 2013-05-20 NOTE — Telephone Encounter (Signed)
Prescription picked up by the patient °

## 2013-05-20 NOTE — Telephone Encounter (Signed)
Routing to Dr Harrison 

## 2013-06-15 ENCOUNTER — Other Ambulatory Visit: Payer: Self-pay | Admitting: Orthopedic Surgery

## 2013-06-15 ENCOUNTER — Telehealth: Payer: Self-pay | Admitting: Orthopedic Surgery

## 2013-06-15 DIAGNOSIS — M25562 Pain in left knee: Principal | ICD-10-CM

## 2013-06-15 DIAGNOSIS — M25561 Pain in right knee: Secondary | ICD-10-CM

## 2013-06-15 MED ORDER — HYDROCODONE-ACETAMINOPHEN 5-325 MG PO TABS: 1.0000 | ORAL_TABLET | ORAL | Status: DC | PRN

## 2013-06-15 NOTE — Telephone Encounter (Signed)
Henry Wolfe asked for a Hydrocodone prescription

## 2013-06-15 NOTE — Telephone Encounter (Signed)
Dr. Romeo AppleHarrison refilled. Tried to call patient, but phone number was not a working number.

## 2013-06-15 NOTE — Telephone Encounter (Signed)
Hydrocodone 5/325 mg Routing to DR. Romeo AppleHarrison

## 2013-06-17 NOTE — Telephone Encounter (Signed)
Prescription picked up by the patient °

## 2013-06-29 ENCOUNTER — Encounter: Payer: Self-pay | Admitting: Orthopedic Surgery

## 2013-06-29 ENCOUNTER — Ambulatory Visit: Payer: 59 | Admitting: Orthopedic Surgery

## 2013-08-10 ENCOUNTER — Telehealth: Payer: Self-pay | Admitting: Orthopedic Surgery

## 2013-08-10 ENCOUNTER — Other Ambulatory Visit: Payer: Self-pay | Admitting: *Deleted

## 2013-08-10 DIAGNOSIS — M25561 Pain in right knee: Secondary | ICD-10-CM

## 2013-08-10 DIAGNOSIS — M25562 Pain in left knee: Principal | ICD-10-CM

## 2013-08-10 MED ORDER — HYDROCODONE-ACETAMINOPHEN 5-325 MG PO TABS: 1.0000 | ORAL_TABLET | ORAL | Status: DC | PRN

## 2013-08-10 NOTE — Telephone Encounter (Signed)
Refill #[id number] 

## 2013-08-10 NOTE — Telephone Encounter (Signed)
Routing to Dr Harrison 

## 2013-08-10 NOTE — Telephone Encounter (Signed)
Refilled per Dr. Romeo AppleHarrison, tried to call patient phone was not working

## 2013-08-10 NOTE — Telephone Encounter (Signed)
Henry Wolfe asked for a prescription for Hydrocodone 5/325

## 2013-09-08 ENCOUNTER — Telehealth: Payer: Self-pay | Admitting: Orthopedic Surgery

## 2013-09-08 NOTE — Telephone Encounter (Signed)
Routing to Dr Harrison 

## 2013-09-08 NOTE — Telephone Encounter (Signed)
Henry LatinoEugene Wolfe asked for a prescription for Hydrocodone 5/325  His # [phone]

## 2013-09-22 NOTE — Telephone Encounter (Signed)
Patient called again requesting refill on Hydrocodone 5/325 mg

## 2013-09-23 ENCOUNTER — Other Ambulatory Visit: Payer: Self-pay | Admitting: Orthopedic Surgery

## 2013-09-23 DIAGNOSIS — M25562 Pain in left knee: Principal | ICD-10-CM

## 2013-09-23 DIAGNOSIS — M25561 Pain in right knee: Secondary | ICD-10-CM

## 2013-09-23 MED ORDER — HYDROCODONE-ACETAMINOPHEN 5-325 MG PO TABS: 1.0000 | ORAL_TABLET | ORAL | Status: AC | PRN

## 2013-09-23 NOTE — Telephone Encounter (Signed)
Prescription is ready to be picked up

## 2013-10-26 ENCOUNTER — Other Ambulatory Visit: Payer: Self-pay | Admitting: Orthopedic Surgery

## 2013-10-26 ENCOUNTER — Telehealth: Payer: Self-pay | Admitting: Orthopedic Surgery

## 2013-10-26 DIAGNOSIS — M25561 Pain in right knee: Secondary | ICD-10-CM

## 2013-10-26 DIAGNOSIS — M25562 Pain in left knee: Principal | ICD-10-CM

## 2013-10-26 MED ORDER — HYDROCODONE-ACETAMINOPHEN 5-325 MG PO TABS: 1.0000 | ORAL_TABLET | ORAL | Status: DC | PRN

## 2013-10-26 NOTE — Telephone Encounter (Signed)
Routing to Dr Harrison 

## 2013-10-26 NOTE — Telephone Encounter (Signed)
Henry Wolfe wants a prescription for Hydrocodone 5/325  His # [phone]

## 2013-10-28 NOTE — Telephone Encounter (Signed)
Patient picked up prescription 10/28/13

## 2013-11-02 ENCOUNTER — Ambulatory Visit: Payer: 59 | Admitting: Orthopedic Surgery

## 2013-11-08 ENCOUNTER — Encounter: Payer: Self-pay | Admitting: Orthopedic Surgery

## 2013-11-08 ENCOUNTER — Ambulatory Visit: Payer: 59 | Admitting: Orthopedic Surgery

## 2013-11-17 ENCOUNTER — Telehealth: Payer: Self-pay | Admitting: *Deleted

## 2013-11-17 NOTE — Telephone Encounter (Signed)
Noted  

## 2013-11-17 NOTE — Telephone Encounter (Signed)
Must be seen 1st

## 2013-11-17 NOTE — Telephone Encounter (Signed)
Patient request refill on Hydrocodone 5/325 mg. Patient re-scheduled appointment 12/20/13.

## 2013-12-20 ENCOUNTER — Ambulatory Visit (INDEPENDENT_AMBULATORY_CARE_PROVIDER_SITE_OTHER): Payer: BC Managed Care – PPO | Admitting: Orthopedic Surgery

## 2013-12-20 ENCOUNTER — Encounter: Payer: Self-pay | Admitting: Orthopedic Surgery

## 2013-12-20 VITALS — BP 118/75 | Ht 68.5 in | Wt 217.0 lb

## 2013-12-20 DIAGNOSIS — M25562 Pain in left knee: Principal | ICD-10-CM

## 2013-12-20 DIAGNOSIS — M25561 Pain in right knee: Secondary | ICD-10-CM

## 2013-12-20 DIAGNOSIS — M25461 Effusion, right knee: Secondary | ICD-10-CM

## 2013-12-20 DIAGNOSIS — M25469 Effusion, unspecified knee: Secondary | ICD-10-CM

## 2013-12-20 DIAGNOSIS — M1711 Unilateral primary osteoarthritis, right knee: Secondary | ICD-10-CM

## 2013-12-20 DIAGNOSIS — M25569 Pain in unspecified knee: Secondary | ICD-10-CM

## 2013-12-20 DIAGNOSIS — M1710 Unilateral primary osteoarthritis, unspecified knee: Secondary | ICD-10-CM

## 2013-12-20 MED ORDER — HYDROCODONE-ACETAMINOPHEN 5-325 MG PO TABS
1.0000 | ORAL_TABLET | ORAL | Status: DC | PRN
Start: 2013-12-20 — End: 2014-01-10

## 2013-12-20 NOTE — Progress Notes (Signed)
Patient ID: Henry Wolfe, male   DOB: June 07, 1960, 53 y.o.   MRN: [id number]  Chief Complaint  Patient presents with  . Follow-up    Requesting fluid drawn off of right knee.    53 year old male with long-standing osteoarthritis and varus deformity of the right knee presents for reevaluation of right knee and right knee swelling. The patient is recently stopped working at the previous job secondary to pain and swelling of the right knee. He does notice that since he is changed his job situation the swelling in his knee is gone down. He still complains of medial joint line pain with effusion and aching in the right knee.  Neuro normal, MSK otherwise normal  No past medical history on file.  Past Surgical History  Procedure Laterality Date  . Right tibial osteotomy      Vital signs: BP 118/75  Ht 5' 8.5" (1.74 m)  Wt 217 lb (98.431 kg)  BMI 32.51 kg/m2   General the patient is well-developed and well-nourished grooming and hygiene are normal Oriented x3 Mood and affect normal Ambulation normal Inspection of the right knee reveals a small to moderate sized effusion., Knee flexion ARC 125, and he remained stable and varus alignment. Motor exam is normal. Skin clean dry and intact  Cardiovascular exam is normal Sensory exam normal  Encounter Diagnoses  Name Primary?  . Knee pain, bilateral Yes  . Primary osteoarthritis of right knee   . Effusion of knee joint right     Knee  Injection Procedure Note  Pre-operative Diagnosis: right  knee oa  Post-operative Diagnosis: same  Indications: pain  Anesthesia: ethyl chloride   Procedure Details   Verbal consent was obtained for the procedure. Time out was completed.The joint was prepped with alcohol, followed by  Ethyl chloride spray and An 18gauge needle was inserted into  the knee via superior-lateral approach; 15 cc of fluid was aspirated, followed by  Injection of 4ml 1% lidocaine and 1 ml of depomedrol.  The needle was  removed and the area cleansed and dressed.  Complications:  None; patient tolerated the procedure well.  Meds ordered this encounter  Medications  . HYDROcodone-acetaminophen (NORCO/VICODIN) 5-325 MG per tablet    Sig: Take 1 tablet by mouth every 4 (four) hours as needed.    Dispense:  90 tablet    Refill:  0    F/u 2 months

## 2013-12-20 NOTE — Patient Instructions (Signed)
Joint Injection  Care After  Refer to this sheet in the next few days. These instructions provide you with information on caring for yourself after you have had a joint injection. Your caregiver also may give you more specific instructions. Your treatment has been planned according to current medical practices, but problems sometimes occur. Call your caregiver if you have any problems or questions after your procedure.  After any type of joint injection, it is not uncommon to experience:  · Soreness, swelling, or bruising around the injection site.  · Mild numbness, tingling, or weakness around the injection site caused by the numbing medicine used before or with the injection.  It also is possible to experience the following effects associated with the specific agent after injection:  · Iodine-based contrast agents:  ¨ Allergic reaction (itching, hives, widespread redness, and swelling beyond the injection site).  · Corticosteroids (These effects are rare.):  ¨ Allergic reaction.  ¨ Increased blood sugar levels (If you have diabetes and you notice that your blood sugar levels have increased, notify your caregiver).  ¨ Increased blood pressure levels.  ¨ Mood swings.  · Hyaluronic acid in the use of viscosupplementation.  ¨ Temporary heat or redness.  ¨ Temporary rash and itching.  ¨ Increased fluid accumulation in the injected joint.  These effects all should resolve within a day after your procedure.   HOME CARE INSTRUCTIONS  · Limit yourself to light activity the day of your procedure. Avoid lifting heavy objects, bending, stooping, or twisting.  · Take prescription or over-the-counter pain medication as directed by your caregiver.  · You may apply ice to your injection site to reduce pain and swelling the day of your procedure. Ice may be applied 03-04 times:  ¨ Put ice in a plastic bag.  ¨ Place a towel between your skin and the bag.  ¨ Leave the ice on for no longer than 15-20 minutes each time.  SEEK  IMMEDIATE MEDICAL CARE IF:   · Pain and swelling get worse rather than better or extend beyond the injection site.  · Numbness does not go away.  · Blood or fluid continues to leak from the injection site.  · You have chest pain.  · You have swelling of your face or tongue.  · You have trouble breathing or you become dizzy.  · You develop a fever, chills, or severe tenderness at the injection site that last longer than 1 day.  MAKE SURE YOU:  · Understand these instructions.  · Watch your condition.  · Get help right away if you are not doing well or if you get worse.  Document Released: 01/31/2011 Document Revised: 08/12/2011 Document Reviewed: 01/31/2011  [employer]® Patient Information ©2015 [employer]. This information is not intended to replace advice given to you by your health care provider. Make sure you discuss any questions you have with your health care provider.

## 2014-01-10 ENCOUNTER — Other Ambulatory Visit: Payer: Self-pay | Admitting: *Deleted

## 2014-01-10 ENCOUNTER — Telehealth: Payer: Self-pay | Admitting: Orthopedic Surgery

## 2014-01-10 DIAGNOSIS — M25562 Pain in left knee: Principal | ICD-10-CM

## 2014-01-10 DIAGNOSIS — M25561 Pain in right knee: Secondary | ICD-10-CM

## 2014-01-10 MED ORDER — HYDROCODONE-ACETAMINOPHEN 5-325 MG PO TABS: 1.0000 | ORAL_TABLET | ORAL | Status: DC | PRN

## 2014-01-10 NOTE — Telephone Encounter (Signed)
Patient called for medication refill: HYDROcodone-acetaminophen (NORCO/VICODIN) 5-325 MG per tablet -- his phone # is [phone].

## 2014-01-10 NOTE — Telephone Encounter (Signed)
Prescription available for pick up, called patient, no answer 

## 2014-01-10 NOTE — Telephone Encounter (Signed)
Patient picked up prescription.

## 2014-02-22 ENCOUNTER — Ambulatory Visit: Payer: BC Managed Care – PPO | Admitting: Orthopedic Surgery

## 2014-02-24 ENCOUNTER — Ambulatory Visit (INDEPENDENT_AMBULATORY_CARE_PROVIDER_SITE_OTHER): Payer: BC Managed Care – PPO | Admitting: Orthopedic Surgery

## 2014-02-24 VITALS — BP 116/74 | Ht 68.5 in | Wt 217.0 lb

## 2014-02-24 DIAGNOSIS — M25569 Pain in unspecified knee: Secondary | ICD-10-CM

## 2014-02-24 DIAGNOSIS — M25561 Pain in right knee: Secondary | ICD-10-CM

## 2014-02-24 DIAGNOSIS — M25562 Pain in left knee: Secondary | ICD-10-CM

## 2014-02-24 DIAGNOSIS — M1710 Unilateral primary osteoarthritis, unspecified knee: Secondary | ICD-10-CM

## 2014-02-24 DIAGNOSIS — M1711 Unilateral primary osteoarthritis, right knee: Secondary | ICD-10-CM

## 2014-02-24 MED ORDER — HYDROCODONE-ACETAMINOPHEN 5-325 MG PO TABS: 1.0000 | ORAL_TABLET | ORAL | Status: DC | PRN

## 2014-02-24 NOTE — Patient Instructions (Addendum)
Take oral pain meds

## 2014-02-24 NOTE — Progress Notes (Signed)
Chief Complaint  Patient presents with  . Follow-up    2 month follow up right knee, s/p injection   BP 116/74  Ht 5' 8.5" (1.74 m)  Wt 217 lb (98.431 kg)  BMI 32.51 kg/m2  The patient did well with his injection. He does need his pain medication refilled because that controls his medial knee pain regarding his right knee. History of osteotomy left knee persistent osteoarthritis bilateral knees  Review of systems no catching locking or giving way  Right knee medial joint line tenderness no swelling range of motion 125 knee stable strength normal skin intact normal distal pulses and sensation  The patient like his knee injected in a month relating to his medication as ordered  Meds ordered this encounter  Medications  . HYDROcodone-acetaminophen (NORCO/VICODIN) 5-325 MG per tablet    Sig: Take 1 tablet by mouth every 4 (four) hours as needed.    Dispense:  120 tablet    Refill:  0     Arthritis controlled with hydrocodone and injection intermittently

## 2014-03-22 ENCOUNTER — Encounter: Payer: Self-pay | Admitting: Orthopedic Surgery

## 2014-03-22 ENCOUNTER — Ambulatory Visit: Payer: BC Managed Care – PPO | Admitting: Orthopedic Surgery

## 2014-05-09 ENCOUNTER — Telehealth: Payer: Self-pay | Admitting: Orthopedic Surgery

## 2014-05-09 ENCOUNTER — Other Ambulatory Visit: Payer: Self-pay | Admitting: *Deleted

## 2014-05-09 DIAGNOSIS — M25562 Pain in left knee: Principal | ICD-10-CM

## 2014-05-09 DIAGNOSIS — M25561 Pain in right knee: Secondary | ICD-10-CM

## 2014-05-09 MED ORDER — HYDROCODONE-ACETAMINOPHEN 5-325 MG PO TABS: 1.0000 | ORAL_TABLET | ORAL | Status: AC | PRN

## 2014-05-09 NOTE — Telephone Encounter (Signed)
Patient picked up prescription.

## 2014-05-09 NOTE — Telephone Encounter (Signed)
Prescription available, called patient, no answer 

## 2014-05-09 NOTE — Telephone Encounter (Signed)
Patient called, requests refill, medication Hydrocodone 5-325; his phone [phone].

## 2014-05-23 ENCOUNTER — Ambulatory Visit (HOSPITAL_COMMUNITY)
Admission: RE | Admit: 2014-05-23 | Discharge: 2014-05-23 | Disposition: A | Discharge status: Home / Self Care | Payer: BC Managed Care – PPO | Source: Ambulatory Visit | Attending: Orthopedic Surgery | Admitting: Orthopedic Surgery

## 2014-05-23 ENCOUNTER — Other Ambulatory Visit: Payer: Self-pay | Admitting: Orthopedic Surgery

## 2014-05-23 DIAGNOSIS — M25761 Osteophyte, right knee: Secondary | ICD-10-CM | POA: Diagnosis not present

## 2014-05-23 DIAGNOSIS — M25461 Effusion, right knee: Secondary | ICD-10-CM | POA: Diagnosis not present

## 2014-05-23 DIAGNOSIS — M25762 Osteophyte, left knee: Secondary | ICD-10-CM | POA: Insufficient documentation

## 2014-05-23 DIAGNOSIS — M25561 Pain in right knee: Secondary | ICD-10-CM | POA: Insufficient documentation

## 2014-05-23 DIAGNOSIS — R936 Abnormal findings on diagnostic imaging of limbs: Secondary | ICD-10-CM | POA: Insufficient documentation

## 2014-05-23 DIAGNOSIS — R52 Pain, unspecified: Secondary | ICD-10-CM

## 2014-05-23 DIAGNOSIS — M25562 Pain in left knee: Secondary | ICD-10-CM | POA: Insufficient documentation

## 2014-05-23 DIAGNOSIS — M170 Bilateral primary osteoarthritis of knee: Secondary | ICD-10-CM | POA: Diagnosis not present

## 2014-05-24 ENCOUNTER — Encounter: Payer: Self-pay | Admitting: Orthopedic Surgery

## 2014-05-24 ENCOUNTER — Ambulatory Visit (INDEPENDENT_AMBULATORY_CARE_PROVIDER_SITE_OTHER): Payer: BC Managed Care – PPO | Admitting: Orthopedic Surgery

## 2014-05-24 VITALS — BP 132/82 | Ht 68.5 in | Wt 217.0 lb

## 2014-05-24 DIAGNOSIS — M25562 Pain in left knee: Secondary | ICD-10-CM

## 2014-05-24 DIAGNOSIS — M25561 Pain in right knee: Secondary | ICD-10-CM

## 2014-05-24 MED ORDER — HYDROCODONE-ACETAMINOPHEN 7.5-325 MG PO TABS: 1.0000 | ORAL_TABLET | Freq: Four times a day (QID) | ORAL | Status: DC | PRN

## 2014-05-24 NOTE — Progress Notes (Signed)
Patient ID: Henry Wolfe, male   DOB: 12-Mar-1961, 53 y.o.   MRN: [id number] Chief Complaint  Patient presents with  . Joint Swelling    Bilateral knee pain, [facility] at [facility].    Bilateral knee pain status post history of high tibial osteotomy left knee  He has bilateral osteoarthritis both knees primarily medial compartment with grade 4 changes on x-ray. He is on hydrocodone 5 mg for pain  He comes in complaining of pain swelling with increased pain in his left knee his right knee pain has been chronic and persistent over the last 2 or 3 years he received injection aspiration last visit  Review of systems negative 2  No past medical history on file.  Both knees have palpable joint effusions. His range of motion is limited. He has varus alignment to the knee both knees however are stable. Motor exam normal skin intact previous skin incision left knee normal. It's curvilinear over the tibia for the proximal tibia high tibial osteotomy. Neurovascular exam intact and symmetrical.  Aspiration injection both knees  Continue current management increased pain medicine to 7.5 mg Norco for 60 tablets 1 every 6 and then resume 5 mg on his next prescription  Follow-up as needed  Imaging both knees x-rays done at [facility] both knees have severe arthritis. That is my independent interpretation of the x-rays.  Procedure note injection and aspiration left knee joint  Verbal consent was obtained to aspirate and inject the left knee joint   Timeout was completed to confirm the site of aspiration and injection  An 18-gauge needle was used to aspirate the left knee joint from a suprapatellar lateral approach.  The medications used were 40 mg of Depo-Medrol and 1% lidocaine 3 cc  Anesthesia was provided by ethyl chloride and the skin was prepped with alcohol.  After cleaning the skin with alcohol an 18-gauge needle was used to aspirate the right knee joint.  We obtained 10 cc of fluid  We  follow this by injection of 40 mg of Depo-Medrol and 3 cc 1% lidocaine.  There were no complications. A sterile bandage was applied.  Procedure note injection and aspiration right knee joint  Verbal consent was obtained to aspirate and inject the right knee joint   Timeout was completed to confirm the site of aspiration and injection  An 18-gauge needle was used to aspirate the knee joint from a suprapatellar lateral approach.  The medications used were 40 mg of Depo-Medrol and 1% lidocaine 3 cc  Anesthesia was provided by ethyl chloride and the skin was prepped with alcohol.  After cleaning the skin with alcohol an 18-gauge needle was used to aspirate the right knee joint.  We obtained 12  cc of fluid  We follow this by injection of 40 mg of Depo-Medrol and 3 cc 1% lidocaine.  There were no complications. A sterile bandage was applied.

## 2014-06-30 ENCOUNTER — Other Ambulatory Visit: Payer: Self-pay | Admitting: *Deleted

## 2014-06-30 ENCOUNTER — Telehealth: Payer: Self-pay | Admitting: Orthopedic Surgery

## 2014-06-30 MED ORDER — HYDROCODONE-ACETAMINOPHEN 7.5-325 MG PO TABS
1.0000 | ORAL_TABLET | Freq: Four times a day (QID) | ORAL | Status: DC | PRN
Start: 2014-06-30 — End: 2014-08-03

## 2014-06-30 NOTE — Telephone Encounter (Signed)
Patient called to request refill on medication:HYDROcodone-acetaminophen (NORCO) 7.5-325 MG per tablet [[id number]][[id number]]

## 2014-07-01 NOTE — Telephone Encounter (Signed)
Patient picked up Rx

## 2014-08-03 ENCOUNTER — Other Ambulatory Visit: Payer: Self-pay | Admitting: *Deleted

## 2014-08-03 ENCOUNTER — Telehealth: Payer: Self-pay | Admitting: Orthopedic Surgery

## 2014-08-03 MED ORDER — HYDROCODONE-ACETAMINOPHEN 7.5-325 MG PO TABS: 1.0000 | ORAL_TABLET | Freq: Four times a day (QID) | ORAL | Status: DC | PRN

## 2014-08-03 NOTE — Telephone Encounter (Signed)
Patient is calling requesting a refill on pain medication HYDROcodone-acetaminophen (NORCO) 7.5-325 MG per tablet [[id number]][[id number]]  Please advise?

## 2014-08-04 NOTE — Telephone Encounter (Signed)
Prescription available, called patient, no answer 

## 2014-08-18 ENCOUNTER — Encounter: Payer: Self-pay | Admitting: Orthopedic Surgery

## 2014-08-18 ENCOUNTER — Ambulatory Visit (INDEPENDENT_AMBULATORY_CARE_PROVIDER_SITE_OTHER): Payer: BLUE CROSS/BLUE SHIELD | Admitting: Orthopedic Surgery

## 2014-08-18 VITALS — BP 142/88 | Ht 68.5 in | Wt 217.0 lb

## 2014-08-18 DIAGNOSIS — M1711 Unilateral primary osteoarthritis, right knee: Secondary | ICD-10-CM

## 2014-08-18 DIAGNOSIS — M25461 Effusion, right knee: Secondary | ICD-10-CM

## 2014-08-18 MED ORDER — HYDROCODONE-ACETAMINOPHEN 7.5-325 MG PO TABS: 1.0000 | ORAL_TABLET | Freq: Four times a day (QID) | ORAL | Status: DC | PRN

## 2014-08-18 NOTE — Patient Instructions (Signed)
Joint Injection  Care After  Refer to this sheet in the next few days. These instructions provide you with information on caring for yourself after you have had a joint injection. Your caregiver also may give you more specific instructions. Your treatment has been planned according to current medical practices, but problems sometimes occur. Call your caregiver if you have any problems or questions after your procedure.  After any type of joint injection, it is not uncommon to experience:  · Soreness, swelling, or bruising around the injection site.  · Mild numbness, tingling, or weakness around the injection site caused by the numbing medicine used before or with the injection.  It also is possible to experience the following effects associated with the specific agent after injection:  · Iodine-based contrast agents:  ¨ Allergic reaction (itching, hives, widespread redness, and swelling beyond the injection site).  · Corticosteroids (These effects are rare.):  ¨ Allergic reaction.  ¨ Increased blood sugar levels (If you have diabetes and you notice that your blood sugar levels have increased, notify your caregiver).  ¨ Increased blood pressure levels.  ¨ Mood swings.  · Hyaluronic acid in the use of viscosupplementation.  ¨ Temporary heat or redness.  ¨ Temporary rash and itching.  ¨ Increased fluid accumulation in the injected joint.  These effects all should resolve within a day after your procedure.   HOME CARE INSTRUCTIONS  · Limit yourself to light activity the day of your procedure. Avoid lifting heavy objects, bending, stooping, or twisting.  · Take prescription or over-the-counter pain medication as directed by your caregiver.  · You may apply ice to your injection site to reduce pain and swelling the day of your procedure. Ice may be applied 03-04 times:  ¨ Put ice in a plastic bag.  ¨ Place a towel between your skin and the bag.  ¨ Leave the ice on for no longer than 15-20 minutes each time.  SEEK  IMMEDIATE MEDICAL CARE IF:   · Pain and swelling get worse rather than better or extend beyond the injection site.  · Numbness does not go away.  · Blood or fluid continues to leak from the injection site.  · You have chest pain.  · You have swelling of your face or tongue.  · You have trouble breathing or you become dizzy.  · You develop a fever, chills, or severe tenderness at the injection site that last longer than 1 day.  MAKE SURE YOU:  · Understand these instructions.  · Watch your condition.  · Get help right away if you are not doing well or if you get worse.  Document Released: 01/31/2011 Document Revised: 08/12/2011 Document Reviewed: 01/31/2011  [employer]® Patient Information ©2015 [employer]. This information is not intended to replace advice given to you by your health care provider. Make sure you discuss any questions you have with your health care provider.

## 2014-08-18 NOTE — Progress Notes (Signed)
Progress note  Established patient problem worse  Chief Complaint  Patient presents with  . Follow-up    follow up bilateral knees s/p asp/inj     Mr. Earlene PlaterDavis comes in for follow-up regarding both knees although the left knee is quite S and at this point. Complains of pain and swelling in the right knee which is somewhat relieved by ice.  Review of systems negative for neurologic or other musculoskeletal joint pain no skin warmth no fever  BP 142/88 mmHg  Ht 5' 8.5" (1.74 m)  Wt 217 lb (98.431 kg)  BMI 32.51 kg/m2  Swollen knee with effusion. Range of motion approximately 120 knee stable strength normal skin intact pulses good sensation normal  Effusion with osteoarthritis  Aspiration injection  Aspirated approximately 15 mL of fluid  Encounter Diagnoses  Name Primary?  . Primary osteoarthritis of right knee Yes  . Effusion of knee joint right     Procedure note injection and aspiration right knee joint  Verbal consent was obtained to aspirate and inject the right knee joint   Timeout was completed to confirm the site of aspiration and injection  An 18-gauge needle was used to aspirate the knee joint from a suprapatellar lateral approach.  The medications used were 40 mg of Depo-Medrol and 1% lidocaine 3 cc  Anesthesia was provided by ethyl chloride and the skin was prepped with alcohol.  After cleaning the skin with alcohol an 18-gauge needle was used to aspirate the right knee joint.  We obtained 15  cc of fluid  We follow this by injection of 40 mg of Depo-Medrol and 3 cc 1% lidocaine.  There were no complications. A sterile bandage was applied.

## 2014-09-15 ENCOUNTER — Encounter: Payer: Self-pay | Admitting: Orthopedic Surgery

## 2014-09-15 ENCOUNTER — Ambulatory Visit (INDEPENDENT_AMBULATORY_CARE_PROVIDER_SITE_OTHER): Payer: BLUE CROSS/BLUE SHIELD | Admitting: Orthopedic Surgery

## 2014-09-15 VITALS — BP 124/83 | Ht 68.5 in | Wt 217.0 lb

## 2014-09-15 DIAGNOSIS — M1711 Unilateral primary osteoarthritis, right knee: Secondary | ICD-10-CM

## 2014-09-15 DIAGNOSIS — M25461 Effusion, right knee: Secondary | ICD-10-CM

## 2014-09-15 MED ORDER — HYDROCODONE-ACETAMINOPHEN 7.5-325 MG PO TABS: 1.0000 | ORAL_TABLET | Freq: Four times a day (QID) | ORAL | Status: DC | PRN

## 2014-09-15 NOTE — Progress Notes (Signed)
Chief Complaint  Patient presents with  . Follow-up    4 week follow up right knee s/p inj/asp    Follow-up after aspiration injection right knee patient said he got a couple of weeks relief and then continue to have medial joint pain. 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 He has severe varus osteoarthritis of the knee with bone-on-bone changes his pain medicine was increased from 5-7.5 mg of hydrocodone but he still having pain after discussion and discussion of treatment options at this point a brace may help him temporarily but he will need a knee replacement I reviewed his x-rays again he has bone-on-bone in varus alignment to the knee which is fairly significant  Recommend he discuss this with his wife the Social Security office to see if he can get any support while he is out of work which is most likely for 12 weeks and then let us know if he wants to have surgery.  Time spent 17 minutes

## 2014-09-15 NOTE — Patient Instructions (Signed)
Call us when you are ready to schedule surgery.   You have decided to proceed with knee replacement surgery. You have decided not to continue with nonoperative measures such as but not limited to oral medication, weight loss, activity modification, physical therapy, bracing, or injection.  We will perform the procedure commonly known as total knee replacement. Some of the risks associated with knee replacement surgery include but are not limited to Bleeding Infection Swelling Stiffness Blood clot Pain that persists even after surgery  Infection is especially devastating complication of knee surgery although rare. If infection does occur your implant will usually have to be removed and several surgeries will be needed to eradicate the infection prior to performing a repeat replacement.   If you're not comfortable with these risks and would like to continue with nonoperative treatment please let Dr. Romeo AppleHarrison know prior to your surgery.

## 2014-10-17 ENCOUNTER — Other Ambulatory Visit: Payer: Self-pay | Admitting: *Deleted

## 2014-10-17 ENCOUNTER — Telehealth: Payer: Self-pay | Admitting: Orthopedic Surgery

## 2014-10-17 MED ORDER — HYDROCODONE-ACETAMINOPHEN 7.5-325 MG PO TABS: 1.0000 | ORAL_TABLET | Freq: Four times a day (QID) | ORAL | Status: DC | PRN

## 2014-10-17 NOTE — Telephone Encounter (Signed)
Patient is calling requesting a refill on pain medication on HYDROcodone-acetaminophen (NORCO) 7.5-325 MG per tablet  Please advise?

## 2014-10-17 NOTE — Telephone Encounter (Signed)
Prescription available, called patient, no answer 

## 2014-10-18 NOTE — Telephone Encounter (Signed)
Patient Picked up Rx 

## 2014-11-14 ENCOUNTER — Telehealth: Payer: Self-pay | Admitting: Orthopedic Surgery

## 2014-11-14 ENCOUNTER — Other Ambulatory Visit: Payer: Self-pay | Admitting: *Deleted

## 2014-11-14 MED ORDER — HYDROCODONE-ACETAMINOPHEN 7.5-325 MG PO TABS: 1.0000 | ORAL_TABLET | Freq: Four times a day (QID) | ORAL | Status: DC | PRN

## 2014-11-14 NOTE — Telephone Encounter (Signed)
Patient is requesting a refill on HYDROcodone-acetaminophen (NORCO) 7.5-325 MG per tablet please advise?

## 2014-11-14 NOTE — Telephone Encounter (Signed)
Prescription available, patient aware  

## 2014-11-14 NOTE — Telephone Encounter (Signed)
Patient picked up Rx

## 2014-12-13 ENCOUNTER — Telehealth: Payer: Self-pay | Admitting: Orthopedic Surgery

## 2014-12-13 ENCOUNTER — Other Ambulatory Visit: Payer: Self-pay | Admitting: *Deleted

## 2014-12-13 MED ORDER — HYDROCODONE-ACETAMINOPHEN 7.5-325 MG PO TABS: 1.0000 | ORAL_TABLET | Freq: Four times a day (QID) | ORAL | Status: DC | PRN

## 2014-12-13 NOTE — Telephone Encounter (Signed)
Prescription available, called patient, no answer 

## 2014-12-13 NOTE — Telephone Encounter (Signed)
Patient aware and has picked up prescription.

## 2014-12-13 NOTE — Telephone Encounter (Signed)
Patient called (ph# [phone]) to request refill of medication:   HYDROcodone-acetaminophen (NORCO) 7.5-325 MG per tablet [[id number]][[id number]]

## 2015-01-05 ENCOUNTER — Telehealth: Payer: Self-pay | Admitting: Orthopedic Surgery

## 2015-01-05 ENCOUNTER — Other Ambulatory Visit: Payer: Self-pay | Admitting: *Deleted

## 2015-01-05 NOTE — Telephone Encounter (Signed)
Relayed to patient, per note; aware to call back next week to request the refill.

## 2015-01-05 NOTE — Telephone Encounter (Signed)
Not due until next week.

## 2015-01-05 NOTE — Telephone Encounter (Signed)
Patient is calling requesting refill on medication HYDROcodone-acetaminophen (NORCO) 7.5-325 MG per tablet please advise?

## 2015-01-09 ENCOUNTER — Telehealth: Payer: Self-pay | Admitting: Orthopedic Surgery

## 2015-01-09 ENCOUNTER — Other Ambulatory Visit: Payer: Self-pay | Admitting: *Deleted

## 2015-01-09 MED ORDER — HYDROCODONE-ACETAMINOPHEN 7.5-325 MG PO TABS: 1.0000 | ORAL_TABLET | Freq: Four times a day (QID) | ORAL | Status: DC | PRN

## 2015-01-09 NOTE — Telephone Encounter (Signed)
Patient is calling requesting medication refill on HYDROcodone-acetaminophen (NORCO) 7.5-325 MG per tablet please advise?

## 2015-01-09 NOTE — Telephone Encounter (Signed)
Prescription available, called patient, no answer 

## 2015-02-13 ENCOUNTER — Telehealth: Payer: Self-pay | Admitting: Orthopedic Surgery

## 2015-02-13 ENCOUNTER — Other Ambulatory Visit: Payer: Self-pay | Admitting: *Deleted

## 2015-02-13 MED ORDER — HYDROCODONE-ACETAMINOPHEN 7.5-325 MG PO TABS: 1.0000 | ORAL_TABLET | Freq: Four times a day (QID) | ORAL | Status: DC | PRN

## 2015-02-13 NOTE — Telephone Encounter (Signed)
Patient is requesting a medication refill on HYDROcodone-acetaminophen (NORCO) 7.5-325 MG per tablet, please advise?

## 2015-02-14 NOTE — Telephone Encounter (Signed)
Patient Picked up Rx 

## 2015-02-14 NOTE — Telephone Encounter (Signed)
Prescription available, called patient, no answer 

## 2015-03-13 ENCOUNTER — Other Ambulatory Visit: Payer: Self-pay | Admitting: *Deleted

## 2015-03-13 ENCOUNTER — Telehealth: Payer: Self-pay | Admitting: Orthopedic Surgery

## 2015-03-13 MED ORDER — HYDROCODONE-ACETAMINOPHEN 7.5-325 MG PO TABS: 1.0000 | ORAL_TABLET | Freq: Four times a day (QID) | ORAL | Status: DC | PRN

## 2015-03-13 NOTE — Telephone Encounter (Signed)
Patient picked up Rx

## 2015-03-13 NOTE — Telephone Encounter (Signed)
Prescription available, called patient, no answer 

## 2015-03-13 NOTE — Telephone Encounter (Signed)
Patient is calling requesting a medication refill on HYDROcodone-acetaminophen (NORCO) 7.5-325 MG per tablet  Please advise? 

## 2015-04-17 ENCOUNTER — Telehealth: Payer: Self-pay | Admitting: *Deleted

## 2015-04-17 ENCOUNTER — Other Ambulatory Visit: Payer: Self-pay | Admitting: *Deleted

## 2015-04-17 MED ORDER — HYDROCODONE-ACETAMINOPHEN 7.5-325 MG PO TABS: 1.0000 | ORAL_TABLET | Freq: Four times a day (QID) | ORAL | Status: DC | PRN

## 2015-04-17 NOTE — Telephone Encounter (Signed)
Prescription available, called patient, no answer 

## 2015-04-17 NOTE — Telephone Encounter (Signed)
Patient called requesting hydrocodone to be refilled. Please advise  

## 2015-04-17 NOTE — Telephone Encounter (Signed)
Patient called back; confirmed prescription ready; picked up prescription.

## 2015-05-15 ENCOUNTER — Telehealth: Payer: Self-pay | Admitting: *Deleted

## 2015-05-15 ENCOUNTER — Other Ambulatory Visit: Payer: Self-pay | Admitting: *Deleted

## 2015-05-15 MED ORDER — HYDROCODONE-ACETAMINOPHEN 7.5-325 MG PO TABS: 1.0000 | ORAL_TABLET | Freq: Four times a day (QID) | ORAL | Status: DC | PRN

## 2015-05-15 NOTE — Telephone Encounter (Signed)
Patient collected RX  

## 2015-05-15 NOTE — Telephone Encounter (Signed)
Prescription available, patient aware  

## 2015-05-15 NOTE — Telephone Encounter (Signed)
Patient called requesting hydrocodone to be refilled. Please advise [phone]

## 2015-06-12 ENCOUNTER — Telehealth: Payer: Self-pay | Admitting: Orthopedic Surgery

## 2015-06-12 ENCOUNTER — Other Ambulatory Visit: Payer: Self-pay | Admitting: *Deleted

## 2015-06-12 MED ORDER — HYDROCODONE-ACETAMINOPHEN 7.5-325 MG PO TABS: 1.0000 | ORAL_TABLET | Freq: Four times a day (QID) | ORAL | Status: DC | PRN

## 2015-06-12 NOTE — Telephone Encounter (Signed)
Patient requests refill of pain medication: HYDROcodone-acetaminophen (NORCO) 7.5-325 MG tablet [[id number]][[id number]] - ph# [phone]

## 2015-06-13 ENCOUNTER — Encounter: Payer: Self-pay | Admitting: Orthopedic Surgery

## 2015-06-13 ENCOUNTER — Other Ambulatory Visit: Payer: Self-pay | Admitting: Orthopedic Surgery

## 2015-06-13 MED ORDER — HYDROCODONE-ACETAMINOPHEN 7.5-325 MG PO TABS: 1.0000 | ORAL_TABLET | Freq: Four times a day (QID) | ORAL | Status: DC | PRN

## 2015-06-13 NOTE — Telephone Encounter (Signed)
Patient returned call. Aware prescription ready for pick-up.

## 2015-06-13 NOTE — Telephone Encounter (Signed)
Prescription picked up, with letter.

## 2015-06-13 NOTE — Telephone Encounter (Signed)
Prescription available, called patient, no answer 

## 2015-07-17 ENCOUNTER — Telehealth: Payer: Self-pay | Admitting: Orthopedic Surgery

## 2015-07-17 ENCOUNTER — Other Ambulatory Visit: Payer: Self-pay | Admitting: *Deleted

## 2015-07-17 MED ORDER — HYDROCODONE-ACETAMINOPHEN 5-325 MG PO TABS: 1.0000 | ORAL_TABLET | Freq: Four times a day (QID) | ORAL | Status: DC | PRN

## 2015-07-17 NOTE — Telephone Encounter (Signed)
Prescription available, patient aware  

## 2015-07-17 NOTE — Telephone Encounter (Signed)
Patient called for refill of medication: HYDROcodone-acetaminophen (NORCO) 7.5-325 MG tablet [[id number]]  - he received a medication wean letter at time of his last request.  Please advise.  His ph# is [phone]

## 2015-08-22 ENCOUNTER — Telehealth: Payer: Self-pay | Admitting: Orthopedic Surgery

## 2015-08-22 NOTE — Telephone Encounter (Signed)
Patient requesting refill of Hydrocodone 5/325mg   Qty 120 Tablets  1 tablet by mouth every 6 hours as needed for moderate pain.

## 2015-08-23 ENCOUNTER — Other Ambulatory Visit: Payer: Self-pay | Admitting: *Deleted

## 2015-08-23 MED ORDER — HYDROCODONE-ACETAMINOPHEN 5-325 MG PO TABS: 1.0000 | ORAL_TABLET | Freq: Four times a day (QID) | ORAL | Status: DC | PRN

## 2015-09-26 ENCOUNTER — Telehealth: Payer: Self-pay | Admitting: Orthopedic Surgery

## 2015-09-26 ENCOUNTER — Other Ambulatory Visit: Payer: Self-pay | Admitting: *Deleted

## 2015-09-26 MED ORDER — HYDROCODONE-ACETAMINOPHEN 5-325 MG PO TABS: 1.0000 | ORAL_TABLET | Freq: Four times a day (QID) | ORAL | Status: DC | PRN

## 2015-11-01 ENCOUNTER — Telehealth: Payer: Self-pay | Admitting: Orthopedic Surgery

## 2015-11-01 ENCOUNTER — Other Ambulatory Visit: Payer: Self-pay | Admitting: *Deleted

## 2015-11-01 NOTE — Telephone Encounter (Signed)
Patient called for refill of medication: HYDROcodone-acetaminophen (NORCO/VICODIN) 5-325 MG tablet [[id number]][[id number]]

## 2015-11-01 NOTE — Telephone Encounter (Signed)
Last script was last of wean scripts. Refill?

## 2015-11-03 ENCOUNTER — Other Ambulatory Visit: Payer: Self-pay | Admitting: Orthopedic Surgery

## 2015-11-03 MED ORDER — HYDROCODONE-ACETAMINOPHEN 5-325 MG PO TABS: 1.0000 | ORAL_TABLET | Freq: Three times a day (TID) | ORAL | Status: DC | PRN

## 2015-11-03 NOTE — Telephone Encounter (Signed)
Reminder  Schedule him for pain management consult  Script refilled

## 2015-11-07 NOTE — Telephone Encounter (Signed)
Referral faxed to [facility], Awaiting appointment.

## 2015-12-07 ENCOUNTER — Telehealth: Payer: Self-pay | Admitting: Orthopedic Surgery

## 2015-12-07 NOTE — Telephone Encounter (Signed)
Patient called and requested a refill on Hydrocodone-Acetaminophen 5-325 mgs.  Qty 90        Sig: Take 1 tablet by mouth every 8 (eight) hours as needed for moderate pain.       ADDENDUM:  Mr. Henry Wolfe has not been seen here since September 15, 2014.  On November 03, 2015, Dr. Romeo AppleHarrison gave the last of 3 wean scripts.  Mr. Henry Wolfe has a referred  for pain management and that was done on November 14, 2015 that requires 4-6 weeks wait for clinical review.  Please advise.

## 2015-12-07 NOTE — Telephone Encounter (Signed)
Patient informed. 

## 2015-12-28 ENCOUNTER — Ambulatory Visit: Payer: BLUE CROSS/BLUE SHIELD | Admitting: Orthopedic Surgery

## 2016-01-04 ENCOUNTER — Ambulatory Visit: Payer: BLUE CROSS/BLUE SHIELD | Admitting: Orthopedic Surgery

## 2016-01-04 NOTE — Progress Notes (Deleted)
No chief complaint on file.  HPI  ROS  No past medical history on file.  Past Surgical History:  Procedure Laterality Date  . right tibial osteotomy     No family history on file. Social History  Substance Use Topics  . Smoking status: Never Smoker  . Smokeless tobacco: Not on file  . Alcohol use No    Current Outpatient Prescriptions:  .  HYDROcodone-acetaminophen (NORCO/VICODIN) 5-325 MG tablet, Take 1 tablet by mouth every 8 (eight) hours as needed for moderate pain., Disp: 90 tablet, Rfl: 0  There were no vitals taken for this visit.  Physical Exam  Ortho Exam   ASSESSMENT: My personal interpretation of the images:  ***    PLAN ***  [name], MD 01/04/2016 4:11 PM

## 2016-01-11 NOTE — Telephone Encounter (Signed)
Patient has an appointment with [facility] 02/02/16. They notified patient.

## 2016-02-02 ENCOUNTER — Encounter
Payer: BLUE CROSS/BLUE SHIELD | Attending: Physical Medicine & Rehabilitation | Admitting: Physical Medicine & Rehabilitation

## 2016-02-27 ENCOUNTER — Ambulatory Visit (INDEPENDENT_AMBULATORY_CARE_PROVIDER_SITE_OTHER): Payer: BLUE CROSS/BLUE SHIELD | Admitting: Orthopedic Surgery

## 2016-02-27 ENCOUNTER — Ambulatory Visit (INDEPENDENT_AMBULATORY_CARE_PROVIDER_SITE_OTHER): Payer: BLUE CROSS/BLUE SHIELD

## 2016-02-27 ENCOUNTER — Encounter: Payer: Self-pay | Admitting: Orthopedic Surgery

## 2016-02-27 VITALS — BP 143/84 | HR 80 | Ht 67.0 in | Wt 210.0 lb

## 2016-02-27 DIAGNOSIS — M1711 Unilateral primary osteoarthritis, right knee: Secondary | ICD-10-CM

## 2016-02-27 DIAGNOSIS — M25561 Pain in right knee: Secondary | ICD-10-CM

## 2016-02-27 DIAGNOSIS — M25461 Effusion, right knee: Secondary | ICD-10-CM | POA: Diagnosis not present

## 2016-02-27 MED ORDER — HYDROCODONE-ACETAMINOPHEN 5-325 MG PO TABS: 1.0000 | ORAL_TABLET | Freq: Three times a day (TID) | ORAL | 0 refills | Status: DC | PRN

## 2016-02-27 NOTE — Patient Instructions (Signed)
OOW NOTE FOR TODAY   You have received an injection of steroids into the joint. 15% of patients will have increased pain within the 24 hours postinjection.   This is transient and will go away.   We recommend that you use ice packs on the injection site for 20 minutes every 2 hours and extra strength Tylenol 2 tablets every 8 as needed until the pain resolves.  If you continue to have pain after taking the Tylenol and using the ice please call the office for further instructions.

## 2016-02-27 NOTE — Progress Notes (Signed)
Patient ID: Henry Wolfe, male   DOB: 1961/04/04, 55 y.o.   MRN: [id number]  Chief Complaint  Patient presents with  . Knee Pain    right knee pain and swelling    HPI Henry Podugene D Reffitt is a 55 y.o. male.  Presents for reevaluation right knee with pain and swelling history of osteoarthritis with severe varus alignment status post left proximal tibial high tibial osteotomy  Patient is on hydrocodone as needed for pain  He was tapered down from hydrocodone 7.5 mg down to 5 mg every 8 hours.  Complains of increased pain medial compartment  Review of Systems Review of Systems 1. Back pain 2. Radicular pain right leg 3. Bowel bladder function intact  He denies any major medical problems such as hypertension diabetes  Nonsmoker   No past medical history on file.  Past Surgical History:  Procedure Laterality Date  . right tibial osteotomy      Social History Social History  Substance Use Topics  . Smoking status: Never Smoker  . Smokeless tobacco: Not on file  . Alcohol use No    No Known Allergies  Current Meds  Medication Sig  . [DISCONTINUED] HYDROcodone-acetaminophen (NORCO/VICODIN) 5-325 MG tablet Take 1 tablet by mouth every 8 (eight) hours as needed for moderate pain.      Physical Exam Physical Exam BP (!) 143/84   Pulse 80   Ht 5\' 7"  (1.702 m)   Wt 210 lb (95.3 kg)   BMI 32.89 kg/m   Gen. appearance. The patient is well-developed and well-nourished, grooming and hygiene are normal. There are no gross congenital abnormalities  The patient is alert and oriented to person place and time  Mood and affect are normal  Ambulation Significant varus thrust significant limp favors right leg  Examination reveals the following: On inspection we find large effusion right knee with medial joint line tenderness  With the range of motion of  flexion contracture 10 flexion 125  Stability tests were normal  collateral ligaments and cruciate ligaments stable  without pseudo-laxity  Strength tests revealed grade 5 motor strength  Skin we find no rash ulceration or erythema  Sensation remains intact  Impression vascular system shows no peripheral edema  Left knee mild varus previous surgical incision no tenderness no swelling  Data Reviewed X-ray show severe varus alignment and medial compartment arthrosis  Assessment    Encounter Diagnoses  Name Primary?  . Right knee pain   . Primary osteoarthritis of right knee   . Effusion of knee joint right Yes    Procedure note injection and aspiration left knee joint  Verbal consent was obtained to aspirate and inject the left knee joint   Timeout was completed to confirm the site of aspiration and injection  An 18-gauge needle was used to aspirate the left knee joint from a suprapatellar lateral approach.  The medications used were 40 mg of Depo-Medrol and 1% lidocaine 3 cc  Anesthesia was provided by ethyl chloride and the skin was prepped with alcohol.  After cleaning the skin with alcohol an 18-gauge needle was used to aspirate the right knee joint.  We obtained 25 clear thick yellow fluid   We follow this by injection of 40 mg of Depo-Medrol and 3 cc 1% lidocaine.  There were no complications. A sterile bandage was applied.     Plan    Aspiration injection was completed patient refilled his medication for hydrocodone every 8 hours  Patient relates to have knee  replacement surgery is bone is starting to deteriorate on the medial side and may need stemmed implant if this isn't done soon       [name] 02/27/2016, 4:08 PM

## 2016-03-08 ENCOUNTER — Encounter: Payer: BLUE CROSS/BLUE SHIELD | Admitting: Physical Medicine & Rehabilitation

## 2016-04-08 ENCOUNTER — Telehealth: Payer: Self-pay | Admitting: Orthopedic Surgery

## 2016-04-08 NOTE — Telephone Encounter (Signed)
Hydrocodone-Acetaminophen 5/325mg  Qty 90 Tablets ° °Take 1 tablet by mouth every 8 (eight) hours as needed for moderate pain °

## 2016-04-09 ENCOUNTER — Other Ambulatory Visit: Payer: Self-pay | Admitting: *Deleted

## 2016-04-09 MED ORDER — HYDROCODONE-ACETAMINOPHEN 5-325 MG PO TABS: 1.0000 | ORAL_TABLET | Freq: Three times a day (TID) | ORAL | 0 refills | Status: DC | PRN

## 2016-04-09 NOTE — Telephone Encounter (Signed)
Routing to Dr Harrison for approval 

## 2016-04-09 NOTE — Telephone Encounter (Signed)
YES

## 2016-05-29 ENCOUNTER — Telehealth: Payer: Self-pay | Admitting: Orthopedic Surgery

## 2016-05-29 NOTE — Telephone Encounter (Signed)
Patient called to ask for refill:  HYDROcodone-acetaminophen (NORCO/VICODIN) 5-325 MG tablet 90 tablet

## 2016-05-30 NOTE — Telephone Encounter (Signed)
ROUTING TO DR HARRISON FOR APPROVAL 

## 2016-06-04 MED ORDER — HYDROCODONE-ACETAMINOPHEN 5-325 MG PO TABS: 1.0000 | ORAL_TABLET | Freq: Three times a day (TID) | ORAL | 0 refills | Status: DC | PRN

## 2016-06-13 ENCOUNTER — Other Ambulatory Visit: Payer: Self-pay | Admitting: *Deleted

## 2016-06-13 MED ORDER — HYDROCODONE-ACETAMINOPHEN 5-325 MG PO TABS: 1.0000 | ORAL_TABLET | Freq: Three times a day (TID) | ORAL | 0 refills | Status: DC | PRN

## 2016-06-14 ENCOUNTER — Encounter: Payer: Self-pay | Admitting: Orthopedic Surgery

## 2016-06-14 NOTE — Progress Notes (Signed)
[place] controlled substance reporting system reviewed  

## 2016-06-25 ENCOUNTER — Ambulatory Visit: Payer: BLUE CROSS/BLUE SHIELD | Admitting: Orthopedic Surgery

## 2016-06-25 ENCOUNTER — Encounter: Payer: Self-pay | Admitting: Orthopedic Surgery

## 2016-08-20 ENCOUNTER — Telehealth: Payer: Self-pay | Admitting: Orthopedic Surgery

## 2016-08-20 ENCOUNTER — Other Ambulatory Visit: Payer: Self-pay | Admitting: *Deleted

## 2016-08-20 MED ORDER — HYDROCODONE-ACETAMINOPHEN 5-325 MG PO TABS: 1.0000 | ORAL_TABLET | Freq: Three times a day (TID) | ORAL | 0 refills | Status: DC | PRN

## 2016-08-20 NOTE — Telephone Encounter (Signed)
REFILL 

## 2016-08-20 NOTE — Telephone Encounter (Signed)
Patient requests refill on Hydrocodone/Acetaminophen 5-325 mgs.  Qty  90       Sig: Take 1 tablet by mouth every 8 (eight) hours as needed for moderate pain. Must last 30 days

## 2016-08-20 NOTE — Telephone Encounter (Signed)
ROUTING TO DR HARRISON FOR APPROVAL 

## 2016-09-09 ENCOUNTER — Ambulatory Visit (INDEPENDENT_AMBULATORY_CARE_PROVIDER_SITE_OTHER): Payer: BLUE CROSS/BLUE SHIELD | Admitting: Orthopedic Surgery

## 2016-09-09 ENCOUNTER — Ambulatory Visit (INDEPENDENT_AMBULATORY_CARE_PROVIDER_SITE_OTHER): Payer: BLUE CROSS/BLUE SHIELD

## 2016-09-09 ENCOUNTER — Encounter: Payer: Self-pay | Admitting: Orthopedic Surgery

## 2016-09-09 VITALS — BP 91/72 | HR 84 | Ht 68.0 in | Wt 206.0 lb

## 2016-09-09 DIAGNOSIS — M792 Neuralgia and neuritis, unspecified: Secondary | ICD-10-CM

## 2016-09-09 DIAGNOSIS — M47812 Spondylosis without myelopathy or radiculopathy, cervical region: Secondary | ICD-10-CM

## 2016-09-09 DIAGNOSIS — M542 Cervicalgia: Secondary | ICD-10-CM

## 2016-09-09 MED ORDER — GABAPENTIN 100 MG PO CAPS: 100.0000 mg | ORAL_CAPSULE | Freq: Every day | ORAL | 1 refills | Status: DC

## 2016-09-09 MED ORDER — PREDNISONE 10 MG (48) PO TBPK: ORAL_TABLET | ORAL | 0 refills | Status: DC

## 2016-09-09 NOTE — Patient Instructions (Signed)
Two medications sent to [facility] physical therapy, they will call you to schedule

## 2016-09-09 NOTE — Progress Notes (Signed)
Patient ID: Henry Wolfe, male   DOB: August 14, 1960, 56 y.o.   MRN: [id number]  Chief Complaint  Patient presents with  . Neck Pain    Neck pain with bilateral shoulder pain and numbness in both hands, fell superbowl night backwards.    HPI Henry Wolfe is a 56 y.o. male.  Presents with neck pain after falling around the time of the Superbowl  He complains of bilateral arm pain with pain in his cervical spine crunching grinding with range of motion and bilateral radicular pain with numbness and tingling in his arms depending on the position.  The pain is not severe but the numbness when present is very irritating  Review of Systems Review of Systems  Constitutional: Positive for fever and unexpected weight change.  Gastrointestinal: Negative.   Endocrine: Negative for polyuria.  Genitourinary: Negative.   Musculoskeletal: Negative for gait problem.       No new gait problem he does have osteoarthritis but nothing related to myelopathy  Neurological: Positive for numbness. Negative for weakness.   (2 MINIMUM)  No past medical history on file.  Past Surgical History:  Procedure Laterality Date  . right tibial osteotomy      Social History Social History  Substance Use Topics  . Smoking status: Never Smoker  . Smokeless tobacco: Not on file  . Alcohol use No    No Known Allergies  No outpatient prescriptions have been marked as taking for the 09/09/16 encounter (Office Visit) with Vickki Hearing, MD.      Physical Exam Physical Exam 1.BP 91/72   Pulse 84   Ht  (1.727 m)   Wt 206 lb (93.4 kg)   BMI 31.32 kg/m   2. Gen. appearance. The patient is well-developed and well-nourished, grooming and hygiene are normal. There are no gross congenital abnormalities  3. The patient is alert and oriented to person place and time  4. Mood and affect are normal  5. Ambulation Waddling gait related to osteoarthritis both knees Examination reveals the  following: Normal range of motion cervical spine negative Spurling test but painful rotation of his neck to the right with crunching and grinding  Both shoulders have full forward elevation and no instability  Grip strength is normal in each hand as is elbow flexion wrist extension wrist flexion and elbow extension  He has no sensory changes or abnormalities in the right or left hand  His reflexes are 2+ at the elbow and forearm  Hoffmann sign negative  Radial pulses are 2+ in each wrist MEDICAL DECISION MAKING:    Data Reviewed C-spine films show cervical disc disease C5-C7 with disc space changes and loss of normal cervical lordosis  Assessment Spondylosis with neurologic symptoms  Plan  Recommend course of physical therapy  Meds ordered this encounter  Medications  . gabapentin (NEURONTIN) 100 MG capsule    Sig: Take 1 capsule (100 mg total) by mouth at bedtime.    Dispense:  30 capsule    Refill:  1  . predniSONE (STERAPRED UNI-PAK 48 TAB) 10 MG (48) TBPK tablet    Sig: Use as directed    Dispense:  48 tablet    Refill:  0   Recheck 6 weeks  [name] 09/09/2016, 5:34 PM

## 2016-09-17 ENCOUNTER — Telehealth: Payer: Self-pay | Admitting: Orthopedic Surgery

## 2016-09-17 NOTE — Telephone Encounter (Signed)
Hydrocodone-Acetaminophen 5/325 mg Qty 90 Tablets  Take 1 tablet by mouth every 8 (eight) hours as needed for moderate pain. Must last 30 days.

## 2016-09-17 NOTE — Telephone Encounter (Signed)
yes

## 2016-09-17 NOTE — Telephone Encounter (Signed)
ROUTING TO DR HARRISON FOR APPROVAL 

## 2016-09-18 ENCOUNTER — Ambulatory Visit
Payer: BLUE CROSS/BLUE SHIELD | Attending: Orthopedic Surgery | Admitting: Rehabilitative and Restorative Service Providers"

## 2016-09-18 ENCOUNTER — Other Ambulatory Visit: Payer: Self-pay | Admitting: *Deleted

## 2016-09-18 DIAGNOSIS — M6281 Muscle weakness (generalized): Secondary | ICD-10-CM | POA: Diagnosis present

## 2016-09-18 DIAGNOSIS — M542 Cervicalgia: Secondary | ICD-10-CM

## 2016-09-18 DIAGNOSIS — R293 Abnormal posture: Secondary | ICD-10-CM | POA: Diagnosis present

## 2016-09-18 MED ORDER — HYDROCODONE-ACETAMINOPHEN 5-325 MG PO TABS: 1.0000 | ORAL_TABLET | Freq: Three times a day (TID) | ORAL | 0 refills | Status: DC | PRN

## 2016-09-18 NOTE — Therapy (Addendum)
[place], [place], [address] Phone: [phone]   Fax:  [phone]  Physical Therapy Evaluation/ Discharge   Patient Details  Name: Henry Wolfe MRN: [id number] Date of Birth: July 08, 1960 Referring Provider: Arther Abbott, MD  Encounter Date: 09/18/2016      PT End of Session - 09/18/16 1455    Visit Number 1   Number of Visits 18   Date for PT Re-Evaluation 10/23/16   PT Start Time 1330   PT Stop Time 1444   PT Time Calculation (min) 74 min   Activity Tolerance Patient tolerated treatment well;No increased pain   Behavior During Therapy [facility] for tasks assessed/performed      No past medical history on file.  Past Surgical History:  Procedure Laterality Date  . right tibial osteotomy      There were no vitals filed for this visit.       Subjective Assessment - 09/18/16 1334    Subjective Neck pain with referral down R UE into thumb and first finger with possible impingement presenting in L UE. Pt reports numbness in R UE/hand to be constant except with self cervical manipulation and L cervical lateral rotation. Pt to begin steroids this week for 7 days.   Pertinent History Neck pain began Superbowl night when pt fell backwards. 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    Limitations Sitting;Lifting;House hold activities   How long can you sit comfortably? 10-15 min   How long can you stand comfortably? 30 min   How long can you walk comfortably? 30 min   Diagnostic tests Radiology reports demo normal spinal alignment with C5-6 and C6-7 osteophyte formation  at each level with uncovertebral joint involvement; mild spondylosis   Patient Stated Goals to be able to perform all activities without pain            [facility] PT Assessment - 09/18/16 0001      Assessment   Medical Diagnosis neck pain   Referring Provider Arther Abbott, MD   Onset Date/Surgical Date 07/04/16   Hand Dominance Right;Left   Next MD Visit 6 weeks   Prior Therapy none     Precautions   Precautions None     Balance Screen   Has the patient fallen in the past 6 months Yes   How many times? 1   Has the patient had a decrease in activity level because of a fear of falling?  No   Is the patient reluctant to leave their home because of a fear of falling?  No     Home Ecologist residence     Prior Function   Level of Independence Independent     Cognition   Overall Cognitive Status Within Functional Limits for tasks assessed     Observation/Other Assessments   Observations pt constantly shifts in chair getting history     Sensation   Light Touch Appears Intact     Coordination   Gross Motor Movements are Fluid and Coordinated Yes     Posture/Postural Control   Posture Comments rounded shoulders, slight forward head, reversible kyphosis     ROM / Strength   AROM / PROM / Strength AROM;PROM;Strength     AROM   Overall AROM  --  shoulder AROM WNL but with L IR painful   Overall AROM Comments cervical flexion, ext 43, L sidebending 35  and R 39. Bil rot 75-78 degrees; L sidebending decreases symptoms     PROM   Overall PROM Comments Cervical PROM WNL with L lateral flexion decreasing R UE radicular sxs, cervical spinal mobility WNL except slight hypomobility noted C4     Strength   Overall Strength Comments R UE strength 4/5, L 3+/5 with pain; dynamometer R 65 and L 79 lbs     Palpation   Spinal mobility C4 hypomobility   Palpation comment R Sternocleidomastoid and anterior scalenes especially tight with palpation increasing R UE radicular sxs to fingers     Special Tests    Special Tests Thoracic Outlet Syndrome;Rotator Cuff Impingement;Laxity/Instability Tests   Thoracic Outlet Syndrome  Tinel's Scalenes   Rotator Cuff Impingment tests Neer impingement test;Hawkins- Kennedy  test;Lift- off test;Empty Can test   Laxity/Instability  Relocation test     Tinel's Scalenes   Findings Positive     Neer Impingement test    Findings Negative     Hawkins-Kennedy test   Findings Negative     Lift-Off test   Findings Negative   Comment but painful L     Empty Can test   Findings Positive     other   Comments Upper Limb tension tests + median nerve; all others -     Relocation test   Comment + R for reduction in radiating symptoms                   [facility] Adult PT Treatment/Exercise - 09/18/16 0001      Exercises   Exercises Neck     Neck Exercises: Supine   Other Supine Exercise chin tucks x 15, chin tucks with shoulder press x 15, attempted shoulder depression but pt unable to perform due to increase in L shoulder pain with popping, HEP issued.                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    Person(s) Educated Patient   Methods Explanation;Demonstration;Tactile cues   Comprehension Verbalized understanding;Returned demonstration          PT Short Term Goals - 09/18/16 1449      PT SHORT TERM GOAL #1   Title Pt will be I with initial HEP for pain management   Baseline issued at eval   Time 3   Period Weeks   Status New     PT SHORT TERM GOAL #2   Title Pt will report at least 25% reduction in R UE radicular sxs to assist with work duties   Baseline unmet   Time 3   Period Weeks   Status New     PT SHORT TERM GOAL #3   Title Pt will report ability to lift L UE overhead with </= 3/10 L shoulder pain referral (along supraspinatus/lateral Deltoid referral) to perform iADLs   Baseline 6/10   Time 3   Period Weeks   Status New           PT Long Term Goals - 09/18/16 1451       PT LONG TERM GOAL #1   Title Pt will report ability to perform work activities with improvement >/= 60% neck/R UE pain   Baseline 0%   Time 6  Period Weeks   Status New     PT LONG TERM GOAL #2   Title Pt will improve R dynamometer strength to >/= 74 lbs to assist with opening jars/performing work duties   Baseline 65 lbs   Time 6   Period Weeks   Status New     PT LONG TERM GOAL #3   Title Pt will demo improved L UE strength to 4-/5 with </= 3/10 pain to assist with performing household duties   Baseline 3+/5, 7/10   Time 6   Period Weeks   Status New     PT LONG TERM GOAL #4   Title Pt will be able to sit >/= 1 hour without restlessness to eat a meal with reduction in pain   Baseline 10-15 min   Time 6   Period Weeks   Status New               Plan - 09/18/16 1444    Clinical Impression Statement Pt presents to PT with cervical pain, + for L shoulder impingement or rotator cuff involvement, R UE radicular sxs and poor posture. Pt works as a Dealer. Pt has + R median nerve testing and + cervical distraction. Drastic difference in grip strength with R being less. L rotator cuff weakness apparent. R wrist extension and L lateral cervical sidebending decreases R UE radicular sxs. poor posture apparent. Palpation of R SCM and ant scalenes also increases R UE radicular sxs.   Rehab Potential Good   PT Frequency 3x / week   PT Duration 6 weeks   PT Treatment/Interventions ADLs/Self Care Home Management;Electrical Stimulation;Functional mobility training;Ultrasound;Traction;Moist Heat;Therapeutic activities;Therapeutic exercise;Patient/family education;Passive range of motion;Manual techniques;Dry needling;Taping   PT Next Visit Plan review HEP, traction, neural glides, progress chin tucks, pec stretching, scapular decompression/retraction exercises, wrist extension stretch   PT Home Exercise Plan decompression shoulder press/chin tuck; see pt education   Consulted  and Agree with Plan of Care Patient      Patient will benefit from skilled therapeutic intervention in order to improve the following deficits and impairments:  Decreased mobility, Decreased strength, Decreased range of motion, Hypomobility, Impaired flexibility, Increased muscle spasms  Visit Diagnosis: Cervicalgia - Plan: PT plan of care cert/re-cert  Abnormal posture - Plan: PT plan of care cert/re-cert  Muscle weakness (generalized) - Plan: PT plan of care cert/re-cert   PHYSICAL THERAPY DISCHARGE SUMMARY  Visits from Start of Care: 1  Current functional level related to goals / functional outcomes: Did not come for follow up    Remaining deficits: Unknown  Education / Equipment: Unknown     Plan: Patient agrees to discharge.  Patient goals were not met. Patient is being discharged due to not returning since the last visit.  ?????       Problem List Patient Active Problem List   Diagnosis Date Noted  . Effusion of knee joint right 12/20/2013  . Primary osteoarthritis of right knee 12/20/2013  . Osteoarthritis of right knee 03/02/2013  . Knee pain, bilateral 03/02/2013  . LOWER LEG, ARTHRITIS, DEGEN./OSTEO 08/19/2007   Carolyne Littles PT DPT  11/14/2016 Myra Rude, PT 09/18/2016, 3:06 PM  [facility] [address] [place], [place], [address] Phone: [phone]   Fax:  [phone]  Name: SREEKAR BROYHILL MRN: [id number] Date of Birth: [birth year]

## 2016-10-02 ENCOUNTER — Ambulatory Visit: Payer: BLUE CROSS/BLUE SHIELD | Admitting: Physical Therapy

## 2016-10-07 ENCOUNTER — Ambulatory Visit: Payer: BLUE CROSS/BLUE SHIELD | Attending: Orthopedic Surgery | Admitting: Physical Therapy

## 2016-10-08 ENCOUNTER — Telehealth: Payer: Self-pay | Admitting: Physical Therapy

## 2016-10-08 ENCOUNTER — Ambulatory Visit: Payer: BLUE CROSS/BLUE SHIELD | Admitting: Physical Therapy

## 2016-10-08 NOTE — Telephone Encounter (Signed)
Therapy spoke with patient regarding missed appointment missed visit on 10/07/2016. He had another appointment scheduled on 10/08/2016. Patient verbalized he would be able to make this appointment but did not show up. Message left on patients phone regarding our attendance policy. Patients visits will be canceled. Patient advised he can call back and schedule 1 visit at a time if he wants to.

## 2016-10-09 ENCOUNTER — Ambulatory Visit: Payer: BLUE CROSS/BLUE SHIELD | Admitting: Physical Therapy

## 2016-10-14 ENCOUNTER — Ambulatory Visit: Payer: BLUE CROSS/BLUE SHIELD | Admitting: Physical Therapy

## 2016-10-16 ENCOUNTER — Ambulatory Visit: Payer: BLUE CROSS/BLUE SHIELD | Admitting: Physical Therapy

## 2016-10-17 ENCOUNTER — Ambulatory Visit: Payer: BLUE CROSS/BLUE SHIELD | Admitting: Physical Therapy

## 2016-10-21 ENCOUNTER — Ambulatory Visit: Payer: BLUE CROSS/BLUE SHIELD | Admitting: Orthopedic Surgery

## 2016-10-22 ENCOUNTER — Ambulatory Visit: Payer: BLUE CROSS/BLUE SHIELD | Admitting: Physical Therapy

## 2016-10-24 ENCOUNTER — Ambulatory Visit: Payer: BLUE CROSS/BLUE SHIELD | Admitting: Physical Therapy

## 2016-10-29 ENCOUNTER — Ambulatory Visit: Payer: BLUE CROSS/BLUE SHIELD | Admitting: Physical Therapy

## 2016-10-30 ENCOUNTER — Ambulatory Visit: Payer: BLUE CROSS/BLUE SHIELD | Admitting: Physical Therapy

## 2016-10-31 ENCOUNTER — Ambulatory Visit: Payer: BLUE CROSS/BLUE SHIELD | Admitting: Physical Therapy

## 2016-11-04 ENCOUNTER — Encounter: Payer: BLUE CROSS/BLUE SHIELD | Admitting: Physical Therapy

## 2016-11-04 ENCOUNTER — Telehealth: Payer: Self-pay | Admitting: Orthopedic Surgery

## 2016-11-04 ENCOUNTER — Ambulatory Visit: Payer: BLUE CROSS/BLUE SHIELD | Admitting: Orthopedic Surgery

## 2016-11-04 NOTE — Telephone Encounter (Signed)
Patient requests refill on Hydrocodone/Acetaminophen 5-325  Mgs.   Qty  90        Sig: Take 1 tablet by mouth every 8 (eight) hours as needed for moderate pain. Must last 30 days  Patient not taking: Reported on 09/18/2016

## 2016-11-04 NOTE — Telephone Encounter (Signed)
Will address at time of office visit today.

## 2016-11-05 ENCOUNTER — Encounter: Payer: BLUE CROSS/BLUE SHIELD | Admitting: Physical Therapy

## 2016-11-06 ENCOUNTER — Encounter: Payer: BLUE CROSS/BLUE SHIELD | Admitting: Physical Therapy

## 2016-11-11 ENCOUNTER — Ambulatory Visit: Payer: BLUE CROSS/BLUE SHIELD | Admitting: Orthopedic Surgery

## 2016-11-11 ENCOUNTER — Encounter: Payer: Self-pay | Admitting: Orthopedic Surgery

## 2016-12-03 ENCOUNTER — Other Ambulatory Visit: Payer: Self-pay | Admitting: *Deleted

## 2016-12-03 ENCOUNTER — Telehealth: Payer: Self-pay | Admitting: Orthopedic Surgery

## 2016-12-03 MED ORDER — HYDROCODONE-ACETAMINOPHEN 5-325 MG PO TABS: 1.0000 | ORAL_TABLET | Freq: Three times a day (TID) | ORAL | 0 refills | Status: DC | PRN

## 2016-12-03 NOTE — Telephone Encounter (Signed)
APPROVED

## 2016-12-03 NOTE — Telephone Encounter (Signed)
Patient requests refill on Hydrocodone/Acetaminophen (Norco)  5-325  Mgs.   Qty  90  Sig: Take 1 tablet by mouth every 8 (eight) hours as needed for moderate pain. Must last 30 days

## 2016-12-03 NOTE — Telephone Encounter (Signed)
ROUTING TO DR HARRISON FOR APPROVAL 

## 2017-01-06 ENCOUNTER — Ambulatory Visit (INDEPENDENT_AMBULATORY_CARE_PROVIDER_SITE_OTHER): Payer: BLUE CROSS/BLUE SHIELD | Admitting: Orthopedic Surgery

## 2017-01-06 DIAGNOSIS — G8929 Other chronic pain: Secondary | ICD-10-CM | POA: Diagnosis not present

## 2017-01-06 DIAGNOSIS — M47812 Spondylosis without myelopathy or radiculopathy, cervical region: Secondary | ICD-10-CM | POA: Diagnosis not present

## 2017-01-06 DIAGNOSIS — M7522 Bicipital tendinitis, left shoulder: Secondary | ICD-10-CM | POA: Diagnosis not present

## 2017-01-06 DIAGNOSIS — M25561 Pain in right knee: Secondary | ICD-10-CM | POA: Diagnosis not present

## 2017-01-06 DIAGNOSIS — M542 Cervicalgia: Secondary | ICD-10-CM | POA: Diagnosis not present

## 2017-01-06 MED ORDER — HYDROCODONE-ACETAMINOPHEN 5-325 MG PO TABS: 1.0000 | ORAL_TABLET | Freq: Three times a day (TID) | ORAL | 0 refills | Status: DC | PRN

## 2017-01-06 MED ORDER — HYDROCODONE-ACETAMINOPHEN 5-325 MG PO TABS: 1.0000 | ORAL_TABLET | ORAL | 0 refills | Status: DC | PRN

## 2017-01-06 NOTE — Progress Notes (Signed)
Routine follow-up visit  The patient was treated for cervical spondylosis without myelopathy and cervical spine x-rays showing degenerative disc disease and he completed his physical therapy with good result in terms of relieving his neurologic symptoms he still has some pain at the base of the cervical spine  However in the process of his physical therapy regarding his cervical spine he was noted to have some anterior shoulder joint pain which is in the rotator interval  Third problem today is patient is having chronic right knee pain he has had several years of discomfort in that right knee and is wishing to proceed with knee replacement when he can get time off from his job he would like his medication increased from 5 mg to 7.5 mg of possible  Review of Systems  Constitutional: Negative for chills and fever.  Respiratory: Negative for shortness of breath.   Cardiovascular: Negative for chest pain.  Musculoskeletal: Positive for joint pain and neck pain.  Neurological: Negative for tingling and sensory change.    Past Surgical History:  Procedure Laterality Date  . right tibial osteotomy        Physical Exam This is a well-developed well-nourished male no obvious deformities. He is oriented 3. His mood is pleasant his affect is normal his gait shows no forms of antalgia  He does have varus deformities of both knees  He has tenderness in the cervical spine but his range of motion has improved he has no grip strength weakness in either hand and no numbness or tingling with Spurling's maneuver  His left shoulder is tender in the rotator interval nontender in the posterior acromion has full passive and active range of motion but painful range of motion in abduction and flexion. There is no instability. He has no weakness in the supraspinatus tendon but he does have pain with muscle resistance testing. The skin is intact neurovascular exam is otherwise normal  He has no axillary lymph  nodes  . Encounter Diagnoses  Name Primary?  . Neck pain   . Cervical spondylosis without myelopathy   . Biceps tendonitis on left   . Chronic pain of right knee Yes    I refilled his hydrocodone but kept it at 5 mg a made every 4 hours when necessary Biceps tendon injection Call office to schedule total knee replacement    Biceps tendon injection left biceps tendon was injected The patient gave verbal consent for cortisone injection Timeout confirmed the site of injection Medications used included 40 mg of Depo-Medrol and 3 mL 1% lidocaine After alcohol and ethyl chloride preparation the point of maximal tenderness was injected over the left  biceps tendon there were no complications

## 2017-03-12 ENCOUNTER — Other Ambulatory Visit: Payer: Self-pay | Admitting: Orthopedic Surgery

## 2017-03-12 ENCOUNTER — Telehealth: Payer: Self-pay | Admitting: Orthopedic Surgery

## 2017-03-12 DIAGNOSIS — M25561 Pain in right knee: Principal | ICD-10-CM

## 2017-03-12 DIAGNOSIS — G8929 Other chronic pain: Secondary | ICD-10-CM

## 2017-03-12 MED ORDER — HYDROCODONE-ACETAMINOPHEN 5-325 MG PO TABS: 1.0000 | ORAL_TABLET | ORAL | 0 refills | Status: DC | PRN

## 2017-03-12 NOTE — Telephone Encounter (Signed)
Ok ready

## 2017-03-12 NOTE — Telephone Encounter (Signed)
Patient requests refill on Hydrocodone/Acetaminophen 5-325  Mgs.   Qty  90  Sig: Take 1 tablet by mouth every 4 (four) hours as needed for moderate pain. Must last 30 days

## 2017-05-19 ENCOUNTER — Telehealth: Payer: Self-pay | Admitting: Orthopedic Surgery

## 2017-05-19 NOTE — Telephone Encounter (Signed)
Patient requests refill On Hydrocodone/Acetaminophen 5-325 mgs.  Qty  90       Sig: Take 1 tablet by mouth every 4 (four) hours as needed for moderate pain. Must last 30 days    Patient states [facility] [facility] in [place].

## 2017-06-04 NOTE — Telephone Encounter (Signed)
Called back to patient to notify. °

## 2017-06-04 NOTE — Telephone Encounter (Signed)
Note: patient's appointment is scheduled Monday, 06/09/17 - await to be approved for approval at appointment?

## 2017-06-04 NOTE — Telephone Encounter (Signed)
YES

## 2017-06-09 ENCOUNTER — Ambulatory Visit: Payer: BLUE CROSS/BLUE SHIELD | Admitting: Orthopedic Surgery

## 2017-06-11 ENCOUNTER — Ambulatory Visit (INDEPENDENT_AMBULATORY_CARE_PROVIDER_SITE_OTHER): Payer: BLUE CROSS/BLUE SHIELD | Admitting: Orthopedic Surgery

## 2017-06-11 ENCOUNTER — Encounter: Payer: Self-pay | Admitting: Orthopedic Surgery

## 2017-06-11 VITALS — BP 143/92 | HR 76 | Ht 68.0 in | Wt 197.0 lb

## 2017-06-11 DIAGNOSIS — G8929 Other chronic pain: Secondary | ICD-10-CM | POA: Diagnosis not present

## 2017-06-11 DIAGNOSIS — M25561 Pain in right knee: Secondary | ICD-10-CM | POA: Diagnosis not present

## 2017-06-11 DIAGNOSIS — M25461 Effusion, right knee: Secondary | ICD-10-CM

## 2017-06-11 MED ORDER — HYDROCODONE-ACETAMINOPHEN 5-325 MG PO TABS: 1.0000 | ORAL_TABLET | ORAL | 0 refills | Status: DC | PRN

## 2017-06-11 NOTE — Progress Notes (Signed)
Progress Note   Patient ID: Henry Wolfe, male   DOB: 1960-09-24, 57 y.o.   MRN: [id number]  Chief Complaint  Patient presents with  . Knee Pain    right     57 yo male with chronic pain right knee present for evaluation of right knee.  C/o moderate increasing aching pain right knee assoc with swelling and difficulty bending the knee      Review of Systems  Constitutional: Negative for chills and fever.  Respiratory: Negative for shortness of breath.   Cardiovascular: Negative for chest pain.  Skin: Negative.    Current Meds  Medication Sig  . HYDROcodone-acetaminophen (NORCO/VICODIN) 5-325 MG tablet Take 1 tablet by mouth every 4 (four) hours as needed for moderate pain. Must last 30 days  . [DISCONTINUED] HYDROcodone-acetaminophen (NORCO/VICODIN) 5-325 MG tablet Take 1 tablet by mouth every 4 (four) hours as needed for moderate pain. Must last 30 days    History reviewed. No pertinent past medical history. of hypertension or diabetes   No Known Allergies  BP (!) 143/92   Pulse 76   Ht 5\' 8"  (1.727 m)   Wt 197 lb (89.4 kg)   BMI 29.95 kg/m    Physical Exam  Constitutional: He is oriented to person, place, and time. He appears well-developed and well-nourished.  Vital signs have been reviewed and are stable. Gen. appearance the patient is well-developed and well-nourished with normal grooming and hygiene.   Musculoskeletal:       Right knee: He exhibits decreased range of motion, swelling and effusion. He exhibits no ecchymosis, no deformity, no LCL laxity and no MCL laxity. Tenderness found. Medial joint line tenderness noted.       Legs: Neurological: He is alert and oriented to person, place, and time. He has normal strength. He displays no atrophy and no tremor. No sensory deficit. He exhibits normal muscle tone. Gait abnormal.  Skin: Skin is warm and dry. No erythema.  Psychiatric: He has a normal mood and affect.  Vitals reviewed.   Right Knee Exam    Other  Effusion: effusion present       Medical decision-making  Imaging: prior imaging shows severe oa right knee    Encounter Diagnoses  Name Primary?  . Acute pain of right knee Yes  . Chronic pain of right knee   . Effusion of knee joint right       Meds ordered this encounter  Medications  . HYDROcodone-acetaminophen (NORCO/VICODIN) 5-325 MG tablet    Sig: Take 1 tablet by mouth every 4 (four) hours as needed for moderate pain. Must last 30 days    Dispense:  90 tablet    Refill:  0   Procedure note injection and aspiration right knee joint  Verbal consent was obtained to aspirate and inject the right knee joint   Timeout was completed to confirm the site of aspiration and injection  An 18-gauge needle was used to aspirate the knee joint from a suprapatellar lateral approach.  The medications used were 40 mg of Depo-Medrol and 1% lidocaine 3 cc  Anesthesia was provided by ethyl chloride and the skin was prepped with alcohol.  After cleaning the skin with alcohol an 18-gauge needle was used to aspirate the right knee joint.  We obtained 30  cc of fluid  We follow this by injection of 40 mg of Depo-Medrol and 3 cc 1% lidocaine.  There were no complications. A sterile bandage was applied.  He says he cant  get the time off of work to have knee surgery   [name], MD 06/11/2017 4:54 PM

## 2017-07-16 ENCOUNTER — Telehealth: Payer: Self-pay | Admitting: Orthopedic Surgery

## 2017-07-16 DIAGNOSIS — G8929 Other chronic pain: Secondary | ICD-10-CM

## 2017-07-16 DIAGNOSIS — M25561 Pain in right knee: Principal | ICD-10-CM

## 2017-07-16 MED ORDER — HYDROCODONE-ACETAMINOPHEN 5-325 MG PO TABS: 1.0000 | ORAL_TABLET | ORAL | 0 refills | Status: DC | PRN

## 2017-07-16 NOTE — Telephone Encounter (Signed)
Patient of Dr. Mort SawyersHarrison's requests refill on Hydrocodone/Acetaminophen 5-325  Mgs.   Qty  90   Sig: Take 1 tablet by mouth every 4 (four) hours as needed for moderate pain. Must last 30 days   Mr. Henry Wolfe states that he uses [facility]

## 2017-08-20 ENCOUNTER — Ambulatory Visit: Payer: BLUE CROSS/BLUE SHIELD | Admitting: Allergy and Immunology

## 2017-08-26 ENCOUNTER — Other Ambulatory Visit: Payer: Self-pay | Admitting: Orthopedic Surgery

## 2017-08-26 DIAGNOSIS — M25561 Pain in right knee: Principal | ICD-10-CM

## 2017-08-26 DIAGNOSIS — G8929 Other chronic pain: Secondary | ICD-10-CM

## 2017-08-26 NOTE — Telephone Encounter (Signed)
Patient requests refill:   (uses [facility]) HYDROcodone-acetaminophen (NORCO/VICODIN) 5-325 MG tablet 90 tablet 0 07/16/2017

## 2017-08-27 MED ORDER — HYDROCODONE-ACETAMINOPHEN 5-325 MG PO TABS: 1.0000 | ORAL_TABLET | ORAL | 0 refills | Status: DC | PRN

## 2017-09-15 ENCOUNTER — Ambulatory Visit: Payer: Self-pay | Admitting: Orthopedic Surgery

## 2017-09-29 ENCOUNTER — Other Ambulatory Visit: Payer: Self-pay | Admitting: Orthopedic Surgery

## 2017-09-29 ENCOUNTER — Ambulatory Visit: Payer: BLUE CROSS/BLUE SHIELD | Admitting: Orthopedic Surgery

## 2017-09-29 DIAGNOSIS — G8929 Other chronic pain: Secondary | ICD-10-CM

## 2017-09-29 DIAGNOSIS — M25561 Pain in right knee: Principal | ICD-10-CM

## 2017-09-29 NOTE — Progress Notes (Deleted)
Progress Note   Patient ID: TAHJ NJOKU, male   DOB: 1960/10/24, 57 y.o.   MRN: [id number]  No chief complaint on file.    Medical decision-making Encounter Diagnoses  Name Primary?  . Chronic pain of right knee Yes  . Cervical spondylosis without myelopathy       No orders of the defined types were placed in this encounter.    PLAN: ***    No chief complaint on file.   HPI   ROS No outpatient medications have been marked as taking for the 09/29/17 encounter (Appointment) with Vickki Hearing, MD.    No Known Allergies   There were no vitals taken for this visit.  Physical Exam     [name], MD 09/29/2017 10:35 AM

## 2017-09-29 NOTE — Telephone Encounter (Signed)
Hydrocodone-Acetaminophen  5/325 mg  Qty 90 Tablets  Take 1 tablet by mouth every 4 (four) hours as needed for moderate pain.  Must last 30 days/  PATIENT USES [facility]

## 2017-09-30 MED ORDER — HYDROCODONE-ACETAMINOPHEN 5-325 MG PO TABS: 1.0000 | ORAL_TABLET | ORAL | 0 refills | Status: DC | PRN

## 2017-10-13 ENCOUNTER — Ambulatory Visit: Payer: BLUE CROSS/BLUE SHIELD | Admitting: Orthopedic Surgery

## 2017-10-13 NOTE — Progress Notes (Deleted)
Progress Note   Patient ID: Henry Wolfe, male   DOB: April 06, 1961, 58 y.o.   MRN: [id number]  No chief complaint on file.    Medical decision-making Encounter Diagnosis  Name Primary?  . Chronic pain of right knee Yes      No orders of the defined types were placed in this encounter.    PLAN: ***    No chief complaint on file.   HPI   ROS No outpatient medications have been marked as taking for the 10/13/17 encounter (Appointment) with Vickki Hearing, MD.    No Known Allergies   There were no vitals taken for this visit.  Physical Exam     Henry Canada, MD 10/13/2017 2:49 PM

## 2017-10-15 ENCOUNTER — Encounter: Payer: Self-pay | Admitting: Orthopedic Surgery

## 2017-11-03 ENCOUNTER — Encounter: Payer: Self-pay | Admitting: Orthopedic Surgery

## 2017-11-03 ENCOUNTER — Ambulatory Visit: Payer: BLUE CROSS/BLUE SHIELD | Admitting: Orthopedic Surgery

## 2017-11-10 ENCOUNTER — Ambulatory Visit: Payer: BLUE CROSS/BLUE SHIELD | Admitting: Allergy and Immunology

## 2017-11-10 ENCOUNTER — Encounter: Payer: Self-pay | Admitting: Allergy and Immunology

## 2017-11-10 ENCOUNTER — Ambulatory Visit: Payer: BLUE CROSS/BLUE SHIELD | Admitting: Orthopedic Surgery

## 2017-11-10 VITALS — BP 125/80 | HR 75 | Ht 67.0 in | Wt 186.0 lb

## 2017-11-10 DIAGNOSIS — M25461 Effusion, right knee: Secondary | ICD-10-CM

## 2017-11-10 DIAGNOSIS — K625 Hemorrhage of anus and rectum: Secondary | ICD-10-CM | POA: Diagnosis not present

## 2017-11-10 DIAGNOSIS — M7522 Bicipital tendinitis, left shoulder: Secondary | ICD-10-CM

## 2017-11-10 DIAGNOSIS — G8929 Other chronic pain: Secondary | ICD-10-CM

## 2017-11-10 DIAGNOSIS — L500 Allergic urticaria: Secondary | ICD-10-CM | POA: Insufficient documentation

## 2017-11-10 DIAGNOSIS — T7800XD Anaphylactic reaction due to unspecified food, subsequent encounter: Secondary | ICD-10-CM

## 2017-11-10 DIAGNOSIS — J3089 Other allergic rhinitis: Secondary | ICD-10-CM

## 2017-11-10 DIAGNOSIS — M25561 Pain in right knee: Secondary | ICD-10-CM | POA: Diagnosis not present

## 2017-11-10 DIAGNOSIS — T7800XA Anaphylactic reaction due to unspecified food, initial encounter: Secondary | ICD-10-CM | POA: Insufficient documentation

## 2017-11-10 MED ORDER — HYDROCODONE-ACETAMINOPHEN 5-325 MG PO TABS: 1.0000 | ORAL_TABLET | ORAL | 0 refills | Status: DC | PRN

## 2017-11-10 MED ORDER — EPINEPHRINE 0.3 MG/0.3ML IJ SOAJ: 0.3000 mg | Freq: Once | INTRAMUSCULAR | 3 refills | Status: AC

## 2017-11-10 MED ORDER — FLUTICASONE PROPIONATE 50 MCG/ACT NA SUSP: 2.0000 | Freq: Every day | NASAL | 5 refills | Status: DC

## 2017-11-10 MED ORDER — LEVOCETIRIZINE DIHYDROCHLORIDE 5 MG PO TABS: 5.0000 mg | ORAL_TABLET | Freq: Every evening | ORAL | 5 refills | Status: DC

## 2017-11-10 NOTE — Patient Instructions (Addendum)
Food allergy The patient's history suggests chocolate allergy and positive skin test results today support this diagnosis.  In addition, his history suggests alpha gal hypersensitivity, which will be evaluated further with laboratory investigation.  Of note, food allergen skin testing was reactive to cow's milk, however he consumes cows milk on a regular basis without symptoms, therefore this represents a false positive result.  A laboratory order form has been provided for tryptase level and serum specific IgE against alpha gal panel.  Meticulous avoidance of chocolate as discussed.  Until alpha gal hypersensitivity has been ruled out, he is to carefully avoid non-mammalian meat.  A prescription has been provided for epinephrine auto-injector 2 pack along with instructions for proper administration.  A food allergy action plan has been provided and discussed.  Medic Alert identification is recommended.  Perennial allergic rhinitis  Aeroallergen avoidance measures have been discussed and provided in written form.  A prescription has been provided for levocetirizine, 5 mg daily as needed.  A prescription has been provided for fluticasone nasal spray, one spray per nostril 1-2 times daily as needed. Proper nasal spray technique has been discussed and demonstrated.  Nasal saline spray (i.e. Simply Saline) is recommended prior to medicated nasal sprays and as needed.  If allergen avoidance measures and medications fail to adequately relieve symptoms, aeroallergen immunotherapy will be considered.   When lab results have returned the patient will be called with further recommendations and follow up instructions.  Control of Dog or Cat Allergen  Avoidance is the best way to manage a dog or cat allergy. If you have a dog or cat and are allergic to dog or cats, consider removing the dog or cat from the home. If you have a dog or cat but don't want to find it a new home, or if your family  wants a pet even though someone in the household is allergic, here are some strategies that may help keep symptoms at bay:  1. Keep the pet out of your bedroom and restrict it to only a few rooms. Be advised that keeping the dog or cat in only one room will not limit the allergens to that room. 2. Don't pet, hug or kiss the dog or cat; if you do, wash your hands with soap and water. 3. High-efficiency particulate air (HEPA) cleaners run continuously in a bedroom or living room can reduce allergen levels over time. 4. Place electrostatic material sheet in the air inlet vent in the bedroom. 5. Regular use of a high-efficiency vacuum cleaner or a central vacuum can reduce allergen levels. 6. Giving your dog or cat a bath at least once a week can reduce airborne allergen.

## 2017-11-10 NOTE — Assessment & Plan Note (Addendum)
The patient's history suggests chocolate allergy and positive skin test results today support this diagnosis.  In addition, his history suggests alpha gal hypersensitivity, which will be evaluated further with laboratory investigation.  Of note, food allergen skin testing was reactive to cow's milk, however he consumes cows milk on a regular basis without symptoms, therefore this represents a false positive result.  A laboratory order form has been provided for tryptase level and serum specific IgE against alpha gal panel.  Meticulous avoidance of chocolate as discussed.  Until alpha gal hypersensitivity has been ruled out, he is to carefully avoid non-mammalian meat.  A prescription has been provided for epinephrine auto-injector 2 pack along with instructions for proper administration.  A food allergy action plan has been provided and discussed.  Medic Alert identification is recommended.

## 2017-11-10 NOTE — Progress Notes (Signed)
Henry Wolfe  11/10/2017  HISTORY SECTION :  Chief Complaint  Patient presents with  . Follow-up    Recheck on right knee.   Problem #1 osteoarthritis right knee effusion comes in with increasing pain swelling decreased range of motion.  Status post tibial osteotomy  Currently on hydrocodone for pain.  Pain is increasing patient is interested in having knee replacement surgery.  Problem #2 patient complains of 1 to 2-week history of pain anterior aspect left shoulder with painful forward elevation flexion associated with weakness when lifting with things away from his body.  Denies trauma   Review of Systems  Constitutional: Negative for chills, fever, malaise/fatigue and weight loss.  Gastrointestinal: Positive for blood in stool.  Musculoskeletal: Positive for joint pain.  Neurological: Negative for tingling and sensory change.    No past medical history on file.  No Known Allergies   Current Outpatient Medications:  .  HYDROcodone-acetaminophen (NORCO/VICODIN) 5-325 MG tablet, Take 1 tablet by mouth every 4 (four) hours as needed for moderate pain. Must last 30 days, Disp: 90 tablet, Rfl: 0 .  gabapentin (NEURONTIN) 100 MG capsule, Take 1 capsule (100 mg total) by mouth at bedtime. (Patient not taking: Reported on 09/18/2016), Disp: 30 capsule, Rfl: 1   PHYSICAL EXAM SECTION: BP 125/80   Pulse 75   Ht 5\' 7"  (1.702 m)   Wt 186 lb (84.4 kg)   BMI 29.13 kg/m   General appearance the patient is normally developed grooming and hygiene are normal  Oriented x3  Mood pleasant affect normal  Gait no disturbances  Extremity #1 right knee Inspection large joint effusion medial joint line tenderness mild varus deformity Flexion arc 110 degrees Test for stability normal Motor exam manual muscle testing 5 out of 5 quadricep strength Skin warm dry intact without rash lesion ulceration Distal pulse and perfusion normal no edema Normal sensation in the  extremity  Extremity #2 left shoulder Tenderness left biceps tendon posterior joint line nontender Decreased flexion and abduction painful arc of flexion and abduction Abduction external rotation test normal Positive impingement sign Positive Speed test Cuff strength 5 out of 5 Skin normal Pulse and perfusion normal Sensation normal Inspection no swelling or tenderness  MEDICAL DECISION SECTION:  Encounter Diagnoses  Name Primary?  . Tendonitis of upper biceps tendon of left shoulder Yes  . Effusion, right knee   . Chronic pain of right knee     Imaging no  Plan:  (Rx., Inj., surg., Frx, MRI/CT, XR:2)  Meds ordered this encounter  Medications  . HYDROcodone-acetaminophen (NORCO/VICODIN) 5-325 MG tablet    Sig: Take 1 tablet by mouth every 4 (four) hours as needed for moderate pain. Must last 30 days    Dispense:  90 tablet    Refill:  0   Procedure note injection and aspiration left knee joint  Verbal consent was obtained to aspirate and inject the left knee joint   Timeout was completed to confirm the site of aspiration and injection  An 18-gauge needle was used to aspirate the left knee joint from a suprapatellar lateral approach.  The medications used were 40 mg of Depo-Medrol and 1% lidocaine 3 cc  Anesthesia was provided by ethyl chloride and the skin was prepped with alcohol.  After cleaning the skin with alcohol an 18-gauge needle was used to aspirate the right knee joint.  We obtained 45 cc of fluid  We followed this by injection of 40 mg of Depo-Medrol and 3 cc 1% lidocaine.  There were no complications. A sterile bandage was applied.   Biceps tendon injection Left biceps tendon was injected The patient gave verbal consent for cortisone injection Timeout confirmed the site of injection Medications used included 40 mg of Depo-Medrol and 3 mL 1% lidocaine After alcohol and ethyl chloride preparation the point of maximal tenderness was injected over  the right biceps tendon there were no complications  He was referred to GI for rectal bleeding complaint

## 2017-11-10 NOTE — Progress Notes (Signed)
New Patient Note  RE: Henry Wolfe MRN: [id number] DOB: Jul 30, 1960 Date of Office Visit: 11/10/2017  Referring provider: Avon Gully, MD Primary care provider: Avon Gully, MD  Chief Complaint: Allergic Reaction and Urticaria   History of present illness: Henry Amber" Wolfe is a 57 y.o. male seen today in consultation requested by Henry Gully, MD.  He reports that over the past several months he has developed hives on his abdomen and upper extremities a few hours after consuming beef and/or lamb.  This is happened on multiple occasions.  As soon is he notices the first few hives, he takes diphenhydramine.  He has not experienced concomitant angioedema, cardiopulmonary symptoms, or GI symptoms.  He was bitten by 3 ticks last year while hunting in the woods. He believes that they may have been [employer].  He also reports that he experiences pharyngeal pruritus within minutes of consuming certain types of chocolate.  He does not experience concomitant urticaria, angioedema, cardiopulmonary symptoms, or GI symptoms. He reports that he has experienced nasal congestion and rhinorrhea "all (his) life".  No significant seasonal symptom variation has been noted nor have specific environmental triggers been identified.  He has taken pseudoephedrine and Actifed in an attempt to control the symptoms.  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  In addition, his history suggests alpha gal hypersensitivity, which will be evaluated further with laboratory investigation.  Of note, food allergen skin testing was reactive to cow's milk, however he consumes cows milk on a regular basis without symptoms, therefore this represents a false positive result.  A laboratory order form has been provided for tryptase level and serum specific IgE against alpha gal panel.  Meticulous avoidance of  chocolate as discussed.  Until alpha gal hypersensitivity has been ruled out, he is to carefully avoid non-mammalian meat.  A prescription has been provided for epinephrine auto-injector 2 pack along with instructions for proper administration.  A food allergy action plan has been provided and discussed.  Medic Alert identification is recommended.  Perennial allergic rhinitis  Aeroallergen avoidance measures have been discussed and provided in written form.  A prescription has been provided for levocetirizine, 5 mg daily as needed.  A prescription has been provided for fluticasone nasal spray, one spray per nostril 1-2 times daily as needed. Proper nasal spray technique has been discussed and demonstrated.  Nasal saline spray (i.e. Simply Saline) is recommended prior to medicated nasal sprays and as needed.  If allergen avoidance measures and medications fail to adequately relieve symptoms, aeroallergen immunotherapy will be considered.   Meds ordered this encounter  Medications  . levocetirizine (XYZAL) 5 MG tablet    Sig: Take 1 tablet (5 mg total) by mouth every evening.    Dispense:  30 tablet    Refill:  5  . fluticasone (FLONASE) 50 MCG/ACT nasal spray    Sig: Place 2 sprays into both nostrils daily.    Dispense:  16 g    Refill:  5  . EPINEPHrine (AUVI-Q) 0.3 mg/0.3 mL IJ SOAJ injection    Sig: Inject 0.3 mLs (0.3 mg total) into the muscle once for 1 dose.    Dispense:  2 Device    Refill:  3    Diagnostics: Environmental skin testing: Positive to dog epithelia. Food allergen skin testing: Positive to chocolate and cows milk. However he consumes cows milk on a regular basis without symptoms, therefore  this represents a false positive result.     Physical examination: Blood pressure 132/84, pulse 84, resp. rate 16, height 5' 6.5" (1.689 m), weight 187 lb (84.8 kg).  General: Alert, interactive, in no acute distress. HEENT: TMs pearly gray, turbinates moderately  edematous with clear discharge, post-pharynx moderately erythematous. Neck: Supple without lymphadenopathy. Lungs: Clear to auscultation without wheezing, rhonchi or rales. CV: Normal S1, S2 without murmurs. Abdomen: Nondistended, nontender. Skin: Warm and dry, without lesions or rashes. Extremities:  No clubbing, cyanosis or edema. Neuro:   Grossly intact.  Review of systems:  Review of systems negative except as noted in HPI / PMHx or noted below: Review of Systems  Constitutional: Negative.   HENT: Negative.   Eyes: Negative.   Respiratory: Negative.   Cardiovascular: Negative.   Gastrointestinal: Negative.   Genitourinary: Negative.   Musculoskeletal: Negative.   Skin: Negative.   Neurological: Negative.   Endo/Heme/Allergies: Negative.   Psychiatric/Behavioral: Negative.     Past medical history:  Other than issues mentioned in the history of present illness, no chronic diseases or recent hospitalizations have been reported.  Past surgical history:  Past Surgical History:  Procedure Laterality Date  . right tibial osteotomy      Family history:   No significant or contributory family history has been reported.  Social history: Social History   Socioeconomic History  . Marital status: Married    Spouse name: Not on file  . Number of children: Not on file  . Years of education: Not on file  . Highest education level: Not on file  Occupational History  . Not on file  Social Needs  . Financial resource strain: Not on file  . Food insecurity:    Worry: Not on file    Inability: Not on file  . Transportation needs:    Medical: Not on file    Non-medical: Not on file  Tobacco Use  . Smoking status: Never Smoker  . Smokeless tobacco: Never Used  Substance and Sexual Activity  . Alcohol use: No  . Drug use: No  . Sexual activity: Not on file  Lifestyle  . Physical activity:    Days per week: Not on file    Minutes per session: Not on file  . Stress: Not  on file  Relationships  . Social connections:    Talks on phone: Not on file    Gets together: Not on file    Attends religious service: Not on file    Active member of club or organization: Not on file    Attends meetings of clubs or organizations: Not on file    Relationship status: Not on file  . Intimate partner violence:    Fear of current or ex partner: Not on file    Emotionally abused: Not on file    Physically abused: Not on file    Forced sexual activity: Not on file  Other Topics Concern  . Not on file  Social History Narrative  . Not on file   Environmental History: The patient lives in a house with carpeting throughout, gas heat, and central air.  There are dogs in the home which have access to his bedroom.  There is no known mold/water damage in the home.  He is a non-smoker.  Allergies as of 11/10/2017   No Known Allergies     Medication List        Accurate as of 11/10/17  6:48 PM. Always use your most recent med  list.          EPINEPHrine 0.3 mg/0.3 mL Soaj injection Commonly known as:  AUVI-Q Inject 0.3 mLs (0.3 mg total) into the muscle once for 1 dose.   fluticasone 50 MCG/ACT nasal spray Commonly known as:  FLONASE Place 2 sprays into both nostrils daily.   gabapentin 100 MG capsule Commonly known as:  NEURONTIN Take 1 capsule (100 mg total) by mouth at bedtime.   HYDROcodone-acetaminophen 5-325 MG tablet Commonly known as:  NORCO/VICODIN Take 1 tablet by mouth every 4 (four) hours as needed for moderate pain. Must last 30 days   levocetirizine 5 MG tablet Commonly known as:  XYZAL Take 1 tablet (5 mg total) by mouth every evening.       Known medication allergies: No Known Allergies  I appreciate the opportunity to take part in Casmer's care. Please do not hesitate to contact me with questions.  Sincerely,   R. Jorene Guest, MD

## 2017-11-10 NOTE — Assessment & Plan Note (Signed)
   Aeroallergen avoidance measures have been discussed and provided in written form.  A prescription has been provided for levocetirizine, 5 mg daily as needed.  A prescription has been provided for fluticasone nasal spray, one spray per nostril 1-2 times daily as needed. Proper nasal spray technique has been discussed and demonstrated.  Nasal saline spray (i.e. Simply Saline) is recommended prior to medicated nasal sprays and as needed.  If allergen avoidance measures and medications fail to adequately relieve symptoms, aeroallergen immunotherapy will be considered.

## 2017-11-13 ENCOUNTER — Other Ambulatory Visit: Payer: Self-pay | Admitting: *Deleted

## 2017-11-13 LAB — ALPHA-GAL PANEL
Alpha Gal IgE*: 4.86 kU/L — ABNORMAL HIGH (ref <0.10)
Beef (Bos spp) IgE: 2.12 kU/L — ABNORMAL HIGH (ref <0.35)
Class Interpretation: 2
Class Interpretation: 2
Lamb/Mutton (Ovis spp) IgE: 0.29 kU/L (ref <0.35)
Pork (Sus spp) IgE: 1.39 kU/L — ABNORMAL HIGH (ref <0.35)

## 2017-11-13 LAB — TRYPTASE: Tryptase: 2.9 ug/L (ref 2.2–13.2)

## 2017-11-13 MED ORDER — EPINEPHRINE 0.3 MG/0.3ML IJ SOAJ: 0.3000 mg | Freq: Once | INTRAMUSCULAR | 2 refills | Status: AC

## 2017-11-19 ENCOUNTER — Ambulatory Visit (INDEPENDENT_AMBULATORY_CARE_PROVIDER_SITE_OTHER): Payer: BLUE CROSS/BLUE SHIELD | Admitting: Internal Medicine

## 2017-11-19 ENCOUNTER — Encounter (INDEPENDENT_AMBULATORY_CARE_PROVIDER_SITE_OTHER): Payer: Self-pay | Admitting: Internal Medicine

## 2017-12-16 ENCOUNTER — Other Ambulatory Visit: Payer: Self-pay | Admitting: Orthopedic Surgery

## 2017-12-16 DIAGNOSIS — M25561 Pain in right knee: Principal | ICD-10-CM

## 2017-12-16 DIAGNOSIS — G8929 Other chronic pain: Secondary | ICD-10-CM

## 2017-12-16 NOTE — Telephone Encounter (Signed)
Patient called to request refill: HYDROcodone-acetaminophen (NORCO/VICODIN) 5-325 MG tablet 1 tablet  -pharmacy: [facility]

## 2017-12-17 MED ORDER — HYDROCODONE-ACETAMINOPHEN 5-325 MG PO TABS: 1.0000 | ORAL_TABLET | ORAL | 0 refills | Status: DC | PRN

## 2018-01-27 ENCOUNTER — Other Ambulatory Visit: Payer: Self-pay | Admitting: Orthopedic Surgery

## 2018-01-27 DIAGNOSIS — M25561 Pain in right knee: Principal | ICD-10-CM

## 2018-01-27 DIAGNOSIS — G8929 Other chronic pain: Secondary | ICD-10-CM

## 2018-01-27 MED ORDER — HYDROCODONE-ACETAMINOPHEN 5-325 MG PO TABS: 1.0000 | ORAL_TABLET | ORAL | 0 refills | Status: DC | PRN

## 2018-01-27 NOTE — Telephone Encounter (Signed)
Patient called for refill:  HYDROcodone-acetaminophen (NORCO/VICODIN) 5-325 MG tablet 90 tablet   [facility]  - patient has appointment scheduled 02/16/18 and is aware.

## 2018-02-16 ENCOUNTER — Ambulatory Visit: Payer: BLUE CROSS/BLUE SHIELD | Admitting: Orthopedic Surgery

## 2018-02-17 ENCOUNTER — Encounter: Payer: Self-pay | Admitting: Orthopedic Surgery

## 2018-03-23 ENCOUNTER — Ambulatory Visit: Payer: BLUE CROSS/BLUE SHIELD | Admitting: Orthopedic Surgery

## 2018-04-06 ENCOUNTER — Ambulatory Visit: Payer: BLUE CROSS/BLUE SHIELD | Admitting: Orthopedic Surgery

## 2018-04-13 ENCOUNTER — Encounter: Payer: Self-pay | Admitting: Orthopedic Surgery

## 2018-04-13 ENCOUNTER — Ambulatory Visit (INDEPENDENT_AMBULATORY_CARE_PROVIDER_SITE_OTHER): Payer: BLUE CROSS/BLUE SHIELD

## 2018-04-13 ENCOUNTER — Ambulatory Visit (INDEPENDENT_AMBULATORY_CARE_PROVIDER_SITE_OTHER): Payer: BLUE CROSS/BLUE SHIELD | Admitting: Orthopedic Surgery

## 2018-04-13 VITALS — BP 135/73 | HR 70 | Ht 66.5 in | Wt 185.0 lb

## 2018-04-13 DIAGNOSIS — M25561 Pain in right knee: Secondary | ICD-10-CM | POA: Diagnosis not present

## 2018-04-13 DIAGNOSIS — M25512 Pain in left shoulder: Secondary | ICD-10-CM

## 2018-04-13 DIAGNOSIS — G8929 Other chronic pain: Secondary | ICD-10-CM | POA: Diagnosis not present

## 2018-04-13 DIAGNOSIS — M25461 Effusion, right knee: Secondary | ICD-10-CM | POA: Diagnosis not present

## 2018-04-13 MED ORDER — HYDROCODONE-ACETAMINOPHEN 5-325 MG PO TABS: 1.0000 | ORAL_TABLET | ORAL | 0 refills | Status: DC | PRN

## 2018-04-13 NOTE — Progress Notes (Signed)
Chief Complaint  Patient presents with  . Shoulder Pain    left  . Knee Pain    right wants injection    History patient came in today to recheck his left shoulder which has improved still having some mild pain with abduction no pain with forward elevation  He wants his right knee aspirated and injected  Complains of right knee pain swelling fluid decreased range of motion with chronic pain for several years.  Knows he needs knee replacement surgery has not been able to do it because of his work schedule  Still complains of moderate pain relieved with hydrocodone would like a refill  Review of systems his neck pain has all but resolved no numbness or tingling in the left arm  Examination BP 135/73   Pulse 70   Ht 5' 6.5" (1.689 m)   Wt 185 lb (83.9 kg)   BMI 29.41 kg/m   His appearance is normal he is oriented x3's mood and affect are normal he walks with a waddling gait  His right knee has a large effusion is tender to palpation over the medial joint line his knee flexion is limited to 90 degrees his knee is stable it is in varus alignment muscle tone and strength are normal skin is intact pulse and perfusion are normal sensation is normal  Left shoulder abduction mild pain full forward elevation with no weakness shoulder is stable skin is normal pulse perfusion left arm normal  Encounter Diagnoses  Name Primary?  . Pain in joint of left shoulder   . Effusion, right knee Yes  . Chronic pain of right knee    Procedure note injection and aspiration right knee joint  Verbal consent was obtained to aspirate and inject the right knee joint   Timeout was completed to confirm the site of aspiration and injection  An 18-gauge needle was used to aspirate the knee joint from a suprapatellar lateral approach.  The medications used were 40 mg of Depo-Medrol and 1% lidocaine 3 cc  Anesthesia was provided by ethyl chloride and the skin was prepped with alcohol.  After cleaning  the skin with alcohol an 18-gauge needle was used to aspirate the right knee joint.  We obtained 40  cc of fluid, yellow fluid  We follow this by injection of 40 mg of Depo-Medrol and 3 cc 1% lidocaine.  There were no complications. A sterile bandage was applied.   Meds ordered this encounter  Medications  . HYDROcodone-acetaminophen (NORCO/VICODIN) 5-325 MG tablet    Sig: Take 1 tablet by mouth every 4 (four) hours as needed for moderate pain. Must last 30 days    Dispense:  90 tablet    Refill:  0   [email or web address] search completed

## 2018-06-15 ENCOUNTER — Other Ambulatory Visit: Payer: Self-pay | Admitting: Orthopedic Surgery

## 2018-06-15 DIAGNOSIS — G8929 Other chronic pain: Secondary | ICD-10-CM

## 2018-06-15 DIAGNOSIS — M25561 Pain in right knee: Principal | ICD-10-CM

## 2018-06-15 NOTE — Telephone Encounter (Signed)
Patient called for refill:  HYDROcodone-acetaminophen (NORCO/VICODIN) 5-325 MG tablet 1 tablet  - requests to change pharmacy to:  [facility], [address] [place]

## 2018-06-16 MED ORDER — HYDROCODONE-ACETAMINOPHEN 5-325 MG PO TABS: 1.0000 | ORAL_TABLET | ORAL | 0 refills | Status: AC | PRN

## 2018-07-13 ENCOUNTER — Encounter: Payer: Self-pay | Admitting: Orthopedic Surgery

## 2018-07-13 ENCOUNTER — Ambulatory Visit (INDEPENDENT_AMBULATORY_CARE_PROVIDER_SITE_OTHER): Payer: BLUE CROSS/BLUE SHIELD | Admitting: Orthopedic Surgery

## 2018-07-13 VITALS — BP 149/90 | HR 82 | Ht 66.5 in | Wt 190.0 lb

## 2018-07-13 DIAGNOSIS — G8929 Other chronic pain: Secondary | ICD-10-CM | POA: Diagnosis not present

## 2018-07-13 DIAGNOSIS — M25561 Pain in right knee: Secondary | ICD-10-CM

## 2018-07-13 DIAGNOSIS — M25461 Effusion, right knee: Secondary | ICD-10-CM | POA: Diagnosis not present

## 2018-07-13 MED ORDER — HYDROCODONE-ACETAMINOPHEN 5-325 MG PO TABS: 1.0000 | ORAL_TABLET | Freq: Four times a day (QID) | ORAL | 0 refills | Status: AC | PRN

## 2018-07-13 NOTE — Progress Notes (Signed)
Progress Note   Patient ID: Henry Wolfe, male   DOB: 1960-09-21, 58 y.o.   MRN: [id number]   Chief Complaint  Patient presents with  . Knee Pain    Right knee    58 year old male with osteoarthritis right knee with recurrent effusions.  He says he was never given his normal yearly time off to have the surgery.  He presents with knee pain and swelling    Review of Systems  Constitutional: Negative for fever.  Skin:       No erythema or skin rash over the right knee     No Known Allergies   BP (!) 149/90   Pulse 82   Ht 5' 6.5" (1.689 m)   Wt 190 lb (86.2 kg)   BMI 30.21 kg/m   Physical Exam Vitals signs reviewed.  Constitutional:      Appearance: Normal appearance. He is well-developed.  Musculoskeletal:     Right knee: He exhibits decreased range of motion, effusion and abnormal alignment. He exhibits no ecchymosis, no deformity, no laceration and no erythema. Tenderness found. Medial joint line tenderness noted.  Skin:    General: Skin is warm and dry.     Findings: No erythema.  Neurological:     Mental Status: He is alert and oriented to person, place, and time.  Psychiatric:        Mood and Affect: Mood and affect normal.      Medical decisions:   Data  Imaging:   no  Encounter Diagnosis  Name Primary?  . Effusion, right knee Yes    PLAN:   Procedure note injection and aspiration right knee joint  Verbal consent was obtained to aspirate and inject the right knee joint   Timeout was completed to confirm the site of aspiration and injection  An 18-gauge needle was used to aspirate the knee joint from a suprapatellar lateral approach.  The medications used were 40 mg of Depo-Medrol and 1% lidocaine 3 cc  Anesthesia was provided by ethyl chloride and the skin was prepped with alcohol.  After cleaning the skin with alcohol an 18-gauge needle was used to aspirate the right knee joint.  We obtained 30  cc of fluid, clear yellow   We  follow this by injection of 40 mg of Depo-Medrol and 3 cc 1% lidocaine.  There were no complications. A sterile bandage was applied.      [name], MD 07/13/2018 4:09 PM

## 2018-08-24 ENCOUNTER — Other Ambulatory Visit: Payer: Self-pay | Admitting: Orthopedic Surgery

## 2018-08-24 MED ORDER — HYDROCODONE-ACETAMINOPHEN 5-325 MG PO TABS: 1.0000 | ORAL_TABLET | Freq: Four times a day (QID) | ORAL | 0 refills | Status: DC | PRN

## 2018-08-24 NOTE — Telephone Encounter (Signed)
Call received from patient for refill:   Hydrocodone-Acetominophen 5/325    - [facility], [address], [place]

## 2018-10-02 ENCOUNTER — Emergency Department (HOSPITAL_COMMUNITY)
Admission: EM | Admit: 2018-10-02 | Discharge: 2018-10-02 | Disposition: A | Discharge status: Home / Self Care | Payer: BLUE CROSS/BLUE SHIELD | Attending: Emergency Medicine | Admitting: Emergency Medicine

## 2018-10-02 ENCOUNTER — Other Ambulatory Visit: Payer: Self-pay

## 2018-10-02 ENCOUNTER — Emergency Department (HOSPITAL_COMMUNITY): Payer: BLUE CROSS/BLUE SHIELD

## 2018-10-02 DIAGNOSIS — H209 Unspecified iridocyclitis: Secondary | ICD-10-CM | POA: Diagnosis not present

## 2018-10-02 DIAGNOSIS — S0181XA Laceration without foreign body of other part of head, initial encounter: Secondary | ICD-10-CM | POA: Insufficient documentation

## 2018-10-02 DIAGNOSIS — Y939 Activity, unspecified: Secondary | ICD-10-CM | POA: Insufficient documentation

## 2018-10-02 DIAGNOSIS — Y929 Unspecified place or not applicable: Secondary | ICD-10-CM | POA: Diagnosis not present

## 2018-10-02 DIAGNOSIS — Y999 Unspecified external cause status: Secondary | ICD-10-CM | POA: Diagnosis not present

## 2018-10-02 DIAGNOSIS — H1132 Conjunctival hemorrhage, left eye: Secondary | ICD-10-CM | POA: Diagnosis not present

## 2018-10-02 DIAGNOSIS — S4992XA Unspecified injury of left shoulder and upper arm, initial encounter: Secondary | ICD-10-CM | POA: Diagnosis not present

## 2018-10-02 DIAGNOSIS — S0083XA Contusion of other part of head, initial encounter: Secondary | ICD-10-CM | POA: Insufficient documentation

## 2018-10-02 MED ORDER — ACETAMINOPHEN 500 MG PO TABS
1000.0000 mg | ORAL_TABLET | Freq: Once | ORAL | Status: AC
  Administered 2018-10-02: 06:00:00 1000 mg via ORAL
  Filled 2018-10-02: qty 2

## 2018-10-02 MED ORDER — ACETAMINOPHEN 500 MG PO TABS: 1000.0000 mg | ORAL_TABLET | Freq: Three times a day (TID) | ORAL | 0 refills | Status: AC
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
## 2018-10-29 ENCOUNTER — Other Ambulatory Visit: Payer: Self-pay | Admitting: Orthopedic Surgery

## 2018-10-29 MED ORDER — HYDROCODONE-ACETAMINOPHEN 5-325 MG PO TABS: 1.0000 | ORAL_TABLET | Freq: Four times a day (QID) | ORAL | 0 refills | Status: DC | PRN

## 2018-10-29 NOTE — Telephone Encounter (Signed)
Patient called for refill: (requests [facility], [place] [address], [place] (said requested this pharmacy last time and Rx went to [facility](previously used) HYDROcodone-acetaminophen (NORCO/VICODIN) 5-325 MG tablet  Patient has also scheduled an appointment in June.

## 2018-11-09 ENCOUNTER — Ambulatory Visit: Payer: BLUE CROSS/BLUE SHIELD | Admitting: Orthopedic Surgery

## 2018-11-09 ENCOUNTER — Encounter: Payer: Self-pay | Admitting: Orthopedic Surgery

## 2018-11-18 ENCOUNTER — Other Ambulatory Visit: Payer: Self-pay

## 2018-11-18 ENCOUNTER — Ambulatory Visit: Payer: BC Managed Care – PPO | Admitting: Orthopedic Surgery

## 2018-11-18 VITALS — BP 105/69 | HR 89 | Ht 69.0 in | Wt 199.0 lb

## 2018-11-18 DIAGNOSIS — G8929 Other chronic pain: Secondary | ICD-10-CM

## 2018-11-18 DIAGNOSIS — M25561 Pain in right knee: Secondary | ICD-10-CM | POA: Diagnosis not present

## 2018-11-18 DIAGNOSIS — M25461 Effusion, right knee: Secondary | ICD-10-CM

## 2018-11-18 MED ORDER — HYDROCODONE-ACETAMINOPHEN 5-325 MG PO TABS: 1.0000 | ORAL_TABLET | Freq: Four times a day (QID) | ORAL | 0 refills | Status: DC | PRN

## 2018-11-18 NOTE — Progress Notes (Signed)
Chief Complaint  Patient presents with  . Follow-up    Recheck on right knee.   request injection aspiration   Procedure note right knee injection   verbal consent was obtained to inject right knee joint  Timeout was completed to confirm the site of injection  The medications used were 40 mg of Depo-Medrol and 1% lidocaine 3 cc  Anesthesia was provided by ethyl chloride and the skin was prepped with alcohol.  After cleaning the skin with alcohol a 20-gauge needle was used to inject the right knee joint. There were no complications. A sterile bandage was applied.  Encounter Diagnoses  Name Primary?  . Chronic pain of right knee Yes  . Effusion of right knee     Meds ordered this encounter  Medications  . HYDROcodone-acetaminophen (NORCO/VICODIN) 5-325 MG tablet    Sig: Take 1 tablet by mouth every 6 (six) hours as needed for moderate pain.    Dispense:  28 tablet    Refill:  0  ]

## 2018-11-25 ENCOUNTER — Encounter: Payer: Self-pay | Admitting: Orthopedic Surgery

## 2018-11-25 ENCOUNTER — Other Ambulatory Visit: Payer: Self-pay

## 2018-11-25 ENCOUNTER — Ambulatory Visit (INDEPENDENT_AMBULATORY_CARE_PROVIDER_SITE_OTHER): Payer: BC Managed Care – PPO

## 2018-11-25 ENCOUNTER — Ambulatory Visit (INDEPENDENT_AMBULATORY_CARE_PROVIDER_SITE_OTHER): Payer: BC Managed Care – PPO | Admitting: Orthopedic Surgery

## 2018-11-25 VITALS — BP 129/71 | HR 91 | Temp 98.2°F | Ht 69.0 in | Wt 200.0 lb

## 2018-11-25 DIAGNOSIS — S43101A Unspecified dislocation of right acromioclavicular joint, initial encounter: Secondary | ICD-10-CM | POA: Diagnosis not present

## 2018-11-25 DIAGNOSIS — M25512 Pain in left shoulder: Secondary | ICD-10-CM | POA: Diagnosis not present

## 2018-11-25 NOTE — Progress Notes (Signed)
Chief Complaint  Patient presents with  . Shoulder Pain    left     58 year old male status post fall several weeks ago injured left shoulder  Comes in complaining of left shoulder pain decreased range of motion.  He was placed in a sling wear that for couple of weeks.  He has pain over the [facility] joint of the left shoulder it is worse when he brings his arm across his body or when he sleeps on it.  The pain is constant slightly improving from initial injury right 5 to 6 weeks ago although still mild to moderate in severity.   Past Medical History:  Diagnosis Date  . High blood pressure     Review of Systems  Musculoskeletal: Positive for joint pain and myalgias.  Neurological: Positive for tingling.      Current Outpatient Medications:  .  HYDROcodone-acetaminophen (NORCO/VICODIN) 5-325 MG tablet, Take 1 tablet by mouth every 6 (six) hours as needed for moderate pain., Disp: 28 tablet, Rfl: 0 .  fluticasone (FLONASE) 50 MCG/ACT nasal spray, Place 2 sprays into both nostrils daily. (Patient not taking: Reported on 11/25/2018), Disp: 16 g, Rfl: 5 .  gabapentin (NEURONTIN) 100 MG capsule, Take 1 capsule (100 mg total) by mouth at bedtime. (Patient not taking: Reported on 11/25/2018), Disp: 30 capsule, Rfl: 1 .  levocetirizine (XYZAL) 5 MG tablet, Take 1 tablet (5 mg total) by mouth every evening. (Patient not taking: Reported on 11/25/2018), Disp: 30 tablet, Rfl: 5  BP 129/71   Pulse 91   Ht 5\' 9"  (1.753 m)   Wt 200 lb (90.7 kg)   BMI 29.53 kg/m   Physical Exam  TENDERNESS AC joint left shoulder ROM normal passive range of motion with pain bringing the arm across his chest and between 120 degrees and full flexion INSTABILITY none detected MOTOR normal cuff SKIN no lesions   CDV 2+ normal color SENSATION normal LYMPH cervical negative  MEDICAL DECISIONS  Data:   [place] Floor fracture dislocation see report Summation of records-   Encounter Diagnosis  Name Primary?  .  Left shoulder pain, unspecified chronicity Yes     Diagnostics   Plan: Routine activities injection left shoulder  Procedure note the subacromial injection shoulder left   Verbal consent was obtained to inject the  Left   Shoulder  Timeout was completed to confirm the injection site is a subacromial space of the  left  shoulder  Medication used Depo-Medrol 40 mg and lidocaine 1% 3 cc  Anesthesia was provided by ethyl chloride  The injection was performed in the left  posterior subacromial space. After pinning the skin with alcohol and anesthetized the skin with ethyl chloride the subacromial space was injected using a 20-gauge needle. There were no complications  Sterile dressing was applied.

## 2018-12-01 ENCOUNTER — Other Ambulatory Visit: Payer: Self-pay | Admitting: Orthopedic Surgery

## 2018-12-01 DIAGNOSIS — G8929 Other chronic pain: Secondary | ICD-10-CM

## 2018-12-01 DIAGNOSIS — M25561 Pain in right knee: Secondary | ICD-10-CM

## 2018-12-01 NOTE — Telephone Encounter (Signed)
Patient called for refill: HYDROcodone-acetaminophen (NORCO/VICODIN) 5-325 MG tablet 28 tablet  - [facility] [phone] ([place])

## 2018-12-02 MED ORDER — HYDROCODONE-ACETAMINOPHEN 5-325 MG PO TABS: 1.0000 | ORAL_TABLET | Freq: Four times a day (QID) | ORAL | 0 refills | Status: DC | PRN

## 2018-12-30 ENCOUNTER — Other Ambulatory Visit: Payer: Self-pay | Admitting: Radiology

## 2018-12-30 DIAGNOSIS — G8929 Other chronic pain: Secondary | ICD-10-CM

## 2018-12-30 DIAGNOSIS — M25561 Pain in right knee: Secondary | ICD-10-CM

## 2018-12-30 MED ORDER — HYDROCODONE-ACETAMINOPHEN 5-325 MG PO TABS: 1.0000 | ORAL_TABLET | Freq: Four times a day (QID) | ORAL | 0 refills | Status: DC | PRN

## 2018-12-30 NOTE — Telephone Encounter (Signed)
Patient called, scheduled an appt for 01/04/19, and is asking for a hydrocodone refill.   [facility] [place].

## 2018-12-30 NOTE — Addendum Note (Signed)
Addended byCandice Camp on: 12/30/2018 02:21 PM   Modules accepted: Orders

## 2019-01-04 ENCOUNTER — Ambulatory Visit: Payer: BC Managed Care – PPO | Admitting: Orthopedic Surgery

## 2019-01-06 ENCOUNTER — Other Ambulatory Visit: Payer: Self-pay

## 2019-01-06 ENCOUNTER — Ambulatory Visit (INDEPENDENT_AMBULATORY_CARE_PROVIDER_SITE_OTHER): Payer: BC Managed Care – PPO | Admitting: Orthopedic Surgery

## 2019-01-06 ENCOUNTER — Encounter: Payer: Self-pay | Admitting: Orthopedic Surgery

## 2019-01-06 VITALS — BP 150/130 | HR 76 | Temp 98.1°F | Ht 69.0 in | Wt 200.0 lb

## 2019-01-06 DIAGNOSIS — M25461 Effusion, right knee: Secondary | ICD-10-CM

## 2019-01-06 DIAGNOSIS — M25561 Pain in right knee: Secondary | ICD-10-CM

## 2019-01-06 DIAGNOSIS — G8929 Other chronic pain: Secondary | ICD-10-CM

## 2019-01-06 MED ORDER — HYDROCODONE-ACETAMINOPHEN 5-325 MG PO TABS: 1.0000 | ORAL_TABLET | Freq: Four times a day (QID) | ORAL | 0 refills | Status: DC | PRN

## 2019-01-06 NOTE — Progress Notes (Signed)
Chief Complaint  Patient presents with  . Knee Pain    right     Procedure note injection and aspiration right knee joint  Verbal consent was obtained to aspirate and inject the right knee joint   Timeout was completed to confirm the site of aspiration and injection  An 18-gauge needle was used to aspirate the knee joint from a suprapatellar lateral approach.  The medications used were 40 mg of Depo-Medrol and 1% lidocaine 3 cc  Anesthesia was provided by ethyl chloride and the skin was prepped with alcohol.  After cleaning the skin with alcohol an 18-gauge needle was used to aspirate the right knee joint.  We obtained 40  cc of fluid clear  We follow this by injection of 40 mg of Depo-Medrol and 3 cc 1% lidocaine.  There were no complications. A sterile bandage was applied.  Encounter Diagnoses  Name Primary?  . Effusion of right knee Yes  . Chronic pain of right knee    FU PRN   Meds ordered this encounter  Medications  . HYDROcodone-acetaminophen (NORCO/VICODIN) 5-325 MG tablet    Sig: Take 1 tablet by mouth every 6 (six) hours as needed for moderate pain.    Dispense:  90 tablet    Refill:  0

## 2019-02-09 ENCOUNTER — Other Ambulatory Visit: Payer: Self-pay | Admitting: Orthopedic Surgery

## 2019-02-09 DIAGNOSIS — G8929 Other chronic pain: Secondary | ICD-10-CM

## 2019-02-09 DIAGNOSIS — M25561 Pain in right knee: Secondary | ICD-10-CM

## 2019-02-09 NOTE — Telephone Encounter (Signed)
Patient requests refill on Hydrocodone/Acetaminophen 5-325  Mgs.  Qty  90  Sig: Take 1 tablet by mouth every 6 (six) hours as needed for moderate pain.  Patient states he uses [facility] in [place] on [address] [employer]

## 2019-02-10 MED ORDER — HYDROCODONE-ACETAMINOPHEN 5-325 MG PO TABS: 1.0000 | ORAL_TABLET | Freq: Four times a day (QID) | ORAL | 0 refills | Status: DC | PRN

## 2019-02-24 ENCOUNTER — Ambulatory Visit: Payer: BC Managed Care – PPO | Admitting: Orthopedic Surgery

## 2019-03-03 ENCOUNTER — Other Ambulatory Visit: Payer: Self-pay

## 2019-03-03 ENCOUNTER — Ambulatory Visit (INDEPENDENT_AMBULATORY_CARE_PROVIDER_SITE_OTHER): Payer: BC Managed Care – PPO | Admitting: Orthopedic Surgery

## 2019-03-03 DIAGNOSIS — G8929 Other chronic pain: Secondary | ICD-10-CM

## 2019-03-03 DIAGNOSIS — M25561 Pain in right knee: Secondary | ICD-10-CM | POA: Diagnosis not present

## 2019-03-03 DIAGNOSIS — M25512 Pain in left shoulder: Secondary | ICD-10-CM

## 2019-03-03 MED ORDER — HYDROCODONE-ACETAMINOPHEN 5-325 MG PO TABS: 1.0000 | ORAL_TABLET | Freq: Four times a day (QID) | ORAL | 0 refills | Status: DC | PRN

## 2019-03-03 NOTE — Progress Notes (Signed)
Henry Wolfe  03/03/2019  HISTORY SECTION :  Chief Complaint  Patient presents with  . Shoulder Pain    Recheck on left shoulder.   Henry Wolfe is a 57-year chronic left shoulder pain he reinjured the left shoulder reaching out to pick up a heavy object and now presents with 2-week history of increasing shoulder pain in the setting of chronic shoulder pain left deltoid associated with weakness and inability to raise his arm above his head he is already gotten 2 injections oral NSAIDs and oral opioids with no relief  Review of Systems  Constitutional: Negative for fever.  Musculoskeletal: Positive for neck pain.  Neurological: Negative for tingling and sensory change.     has a past medical history of High blood pressure.   Past Surgical History:  Procedure Laterality Date  . right tibial osteotomy      Body mass index is 29.53 kg/m.   No Known Allergies   Current Outpatient Medications:  .  HYDROcodone-acetaminophen (NORCO/VICODIN) 5-325 MG tablet, Take 1 tablet by mouth every 6 (six) hours as needed for moderate pain., Disp: 90 tablet, Rfl: 0   PHYSICAL EXAM SECTION: 1) BP 137/82   Pulse 91   Temp 98.4 F (36.9 C)   Ht 5\' 9"  (1.753 m)   Wt 200 lb (90.7 kg)   BMI 29.53 kg/m   Body mass index is 29.53 kg/m. General appearance: Well-developed well-nourished no gross deformities  2) Cardiovascular normal pulse and perfusion in the upper extremities normal color without edema  3) Neurologically deep tendon reflexes are equal and normal, no sensation loss or deficits no pathologic reflexes  4) Psychological: Awake alert and oriented x3 mood and affect normal  5) Skin no lacerations or ulcerations no nodularity no palpable masses, no erythema or nodularity  6) Musculoskeletal: Right shoulder normal range of motion  Left shoulder tenderness in the deltoid rotator interval decreased range of motion decreased abduction weak abduction weak flexion positive impingement  positive drop arm test no instability   MEDICAL DECISION SECTION:  Encounter Diagnoses  Name Primary?  . Chronic left shoulder pain   . Chronic pain of right knee     Imaging Previous x-ray showed no fracture or dislocation  Plan:  (Rx., Inj., surg., Frx, MRI/CT, XR:2) Injection MRI  Refill pain medicine  Procedure note the subacromial injection shoulder left   Verbal consent was obtained to inject the  Left   Shoulder  Timeout was completed to confirm the injection site is a subacromial space of the  left  shoulder  Medication used Depo-Medrol 40 mg and lidocaine 1% 3 cc  Anesthesia was provided by ethyl chloride  The injection was performed in the left  posterior subacromial space. After pinning the skin with alcohol and anesthetized the skin with ethyl chloride the subacromial space was injected using a 20-gauge needle. There were no complications  Sterile dressing was applied.  Meds ordered this encounter  Medications  . HYDROcodone-acetaminophen (NORCO/VICODIN) 5-325 MG tablet    Sig: Take 1 tablet by mouth every 6 (six) hours as needed for moderate pain.    Dispense:  90 tablet    Refill:  0      4:01 PM Arther Abbott, MD  03/03/2019

## 2019-03-03 NOTE — Patient Instructions (Signed)

## 2019-03-29 ENCOUNTER — Other Ambulatory Visit: Payer: Self-pay

## 2019-03-29 DIAGNOSIS — G8929 Other chronic pain: Secondary | ICD-10-CM

## 2019-03-29 MED ORDER — HYDROCODONE-ACETAMINOPHEN 5-325 MG PO TABS: 1.0000 | ORAL_TABLET | Freq: Four times a day (QID) | ORAL | 0 refills | Status: DC | PRN

## 2019-03-29 NOTE — Telephone Encounter (Signed)
Hydrocodone-Acetaminophen 5/325mg   Qty 90 Tablets  Take 1 tablet by mouth every 6(six) hours as needed for moderate pain.   PATIENT USES [facility]

## 2019-04-06 ENCOUNTER — Other Ambulatory Visit: Payer: BC Managed Care – PPO

## 2019-04-07 ENCOUNTER — Other Ambulatory Visit: Payer: Self-pay

## 2019-04-07 ENCOUNTER — Ambulatory Visit
Admission: RE | Admit: 2019-04-07 | Discharge: 2019-04-07 | Disposition: A | Discharge status: Home / Self Care | Payer: BC Managed Care – PPO | Source: Ambulatory Visit | Attending: Orthopedic Surgery | Admitting: Orthopedic Surgery

## 2019-04-07 DIAGNOSIS — G8929 Other chronic pain: Secondary | ICD-10-CM

## 2019-04-11 ENCOUNTER — Telehealth: Payer: Self-pay | Admitting: Orthopedic Surgery

## 2019-04-11 NOTE — Telephone Encounter (Signed)
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

## 2019-04-12 ENCOUNTER — Telehealth: Payer: Self-pay | Admitting: Orthopedic Surgery

## 2019-04-12 ENCOUNTER — Ambulatory Visit: Payer: BC Managed Care – PPO | Admitting: Orthopedic Surgery

## 2019-04-12 DIAGNOSIS — G8929 Other chronic pain: Secondary | ICD-10-CM

## 2019-04-12 DIAGNOSIS — M25512 Pain in left shoulder: Secondary | ICD-10-CM

## 2019-04-12 NOTE — Telephone Encounter (Signed)
I called patient earlier this morning regarding his scheduled appointment for MRI result review, and he stated that Dr Aline Brochure called him yesterday with the results. States something was mentioned about referring him - said isn't Dr Aline Brochure going to do the surgery?  Please advise. The other question per patient is, when should he return for appointment about his knees?   [phone]

## 2019-04-12 NOTE — Telephone Encounter (Signed)
Dr Aline Brochure note states reverse shoulder replacement needed have put in referral to Dr Marlou Sa. Dr Aline Brochure does not perform reverse shoulder surgery.   I called patient to advise.   He wants to see Dr Aline Brochure for his knees, I told him you will call him back with that appointment

## 2019-04-12 NOTE — Telephone Encounter (Signed)
Appointment scheduled for knee; patient aware.

## 2019-04-21 ENCOUNTER — Other Ambulatory Visit: Payer: Self-pay

## 2019-04-21 ENCOUNTER — Encounter: Payer: Self-pay | Admitting: Orthopedic Surgery

## 2019-04-21 ENCOUNTER — Ambulatory Visit (INDEPENDENT_AMBULATORY_CARE_PROVIDER_SITE_OTHER): Payer: BC Managed Care – PPO | Admitting: Orthopedic Surgery

## 2019-04-21 DIAGNOSIS — M75122 Complete rotator cuff tear or rupture of left shoulder, not specified as traumatic: Secondary | ICD-10-CM | POA: Diagnosis not present
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
## 2019-04-26 ENCOUNTER — Ambulatory Visit (INDEPENDENT_AMBULATORY_CARE_PROVIDER_SITE_OTHER): Payer: BC Managed Care – PPO | Admitting: Orthopedic Surgery

## 2019-04-26 ENCOUNTER — Encounter: Payer: Self-pay | Admitting: Orthopedic Surgery

## 2019-04-26 ENCOUNTER — Other Ambulatory Visit: Payer: Self-pay

## 2019-04-26 VITALS — BP 125/70 | HR 80 | Ht 69.0 in | Wt 243.0 lb

## 2019-04-26 DIAGNOSIS — G8929 Other chronic pain: Secondary | ICD-10-CM

## 2019-04-26 DIAGNOSIS — M25512 Pain in left shoulder: Secondary | ICD-10-CM | POA: Diagnosis not present

## 2019-04-26 DIAGNOSIS — M25461 Effusion, right knee: Secondary | ICD-10-CM

## 2019-04-26 MED ORDER — GABAPENTIN 100 MG PO CAPS: 100.0000 mg | ORAL_CAPSULE | Freq: Three times a day (TID) | ORAL | 2 refills | Status: DC

## 2019-04-26 NOTE — Progress Notes (Signed)
Chief Complaint  Patient presents with  . Knee Pain    right/ wants injection / aspiration     Meds ordered this encounter  Medications  . gabapentin (NEURONTIN) 100 MG capsule    Sig: Take 1 capsule (100 mg total) by mouth 3 (three) times daily.    Dispense:  90 capsule    Refill:  2   Right knee swelling recurrent effusion  Procedure note injection and aspiration right knee joint  Verbal consent was obtained to aspirate and inject the right knee joint   Timeout was completed to confirm the site of aspiration and injection  An 18-gauge needle was used to aspirate the knee joint from a suprapatellar lateral approach.  The medications used were 40 mg of Depo-Medrol and 1% lidocaine 3 cc  Anesthesia was provided by ethyl chloride and the skin was prepped with alcohol.  After cleaning the skin with alcohol an 18-gauge needle was used to aspirate the right knee joint.  We obtained 40  cc of fluid clear yellow  We follow this by injection of 40 mg of Depo-Medrol and 3 cc 1% lidocaine.  There were no complications. A sterile bandage was applied.   Patient request injection left shoulder says surgery not planned for December may be next year  Patient was sent to Dr. Marlou Sa for possible surgical intervention on his left shoulder rotator cuff tear  Since the surgery is not planned for over a month I approved that he get an injection in the left shoulder subacromial space  Procedure note the subacromial injection shoulder left   Verbal consent was obtained to inject the  Left   Shoulder  Timeout was completed to confirm the injection site is a subacromial space of the  left  shoulder  Medication used Depo-Medrol 40 mg and lidocaine 1% 3 cc  Anesthesia was provided by ethyl chloride  The injection was performed in the left  posterior subacromial space. After pinning the skin with alcohol and anesthetized the skin with ethyl chloride the subacromial space was injected using a  20-gauge needle. There were no complications  Sterile dressing was applied.  Mr. Inge has been on hydrocodone for quite a long time several years to be exact and wanted something stronger however, I advised him that this is not in his best interest and that we would try something else to help potentiate his hydrocodone and I prescribed him some gabapentin to take 3 times a day  Meds ordered this encounter  Medications  . gabapentin (NEURONTIN) 100 MG capsule    Sig: Take 1 capsule (100 mg total) by mouth 3 (three) times daily.    Dispense:  90 capsule    Refill:  2

## 2019-04-26 NOTE — Patient Instructions (Signed)

## 2019-04-27 ENCOUNTER — Other Ambulatory Visit: Payer: Self-pay | Admitting: Orthopedic Surgery

## 2019-04-27 DIAGNOSIS — M25561 Pain in right knee: Secondary | ICD-10-CM

## 2019-04-27 DIAGNOSIS — G8929 Other chronic pain: Secondary | ICD-10-CM

## 2019-04-27 MED ORDER — HYDROCODONE-ACETAMINOPHEN 5-325 MG PO TABS: 1.0000 | ORAL_TABLET | Freq: Four times a day (QID) | ORAL | 0 refills | Status: DC | PRN

## 2019-04-27 NOTE — Telephone Encounter (Signed)
Patient called to relay that he did receive his Gabapentin medication ordered by Dr Aline Brochure at time of visit yesterday 04/26/19. Requests the other medication discussed - requests refill on the following: HYDROcodone-acetaminophen (NORCO/VICODIN) 5-325 MG tablet / 90 tablets [employer], [place]

## 2019-05-20 ENCOUNTER — Telehealth: Payer: Self-pay | Admitting: Orthopedic Surgery

## 2019-05-20 NOTE — Telephone Encounter (Signed)
Patient is scheduled for left shoulder surgery with Dr. Marlou Sa Jan 5th, 2021.  I called patient and left message on his voice mail wanting to confirm he had received a hardcopy in the mail with his surgery date and time along with time and date for covid test and post op appointment.  Patient will also need to come in the office on the 4th of January at 1pm to be fitted by [employer] for the black shoulder brace he will need for recovery.

## 2019-06-02 ENCOUNTER — Encounter (HOSPITAL_BASED_OUTPATIENT_CLINIC_OR_DEPARTMENT_OTHER)
Admission: RE | Admit: 2019-06-02 | Discharge: 2019-06-02 | Disposition: A | Discharge status: Home / Self Care | Payer: BC Managed Care – PPO | Source: Ambulatory Visit | Attending: Orthopedic Surgery | Admitting: Orthopedic Surgery

## 2019-06-02 ENCOUNTER — Other Ambulatory Visit: Payer: Self-pay

## 2019-06-02 ENCOUNTER — Encounter (HOSPITAL_BASED_OUTPATIENT_CLINIC_OR_DEPARTMENT_OTHER): Payer: Self-pay | Admitting: Orthopedic Surgery

## 2019-06-02 DIAGNOSIS — Y939 Activity, unspecified: Secondary | ICD-10-CM | POA: Diagnosis not present

## 2019-06-02 DIAGNOSIS — M19042 Primary osteoarthritis, left hand: Secondary | ICD-10-CM | POA: Diagnosis not present

## 2019-06-02 DIAGNOSIS — D649 Anemia, unspecified: Secondary | ICD-10-CM | POA: Diagnosis not present

## 2019-06-02 DIAGNOSIS — Z6838 Body mass index (BMI) 38.0-38.9, adult: Secondary | ICD-10-CM | POA: Diagnosis not present

## 2019-06-02 DIAGNOSIS — M19041 Primary osteoarthritis, right hand: Secondary | ICD-10-CM | POA: Diagnosis not present

## 2019-06-02 DIAGNOSIS — M75102 Unspecified rotator cuff tear or rupture of left shoulder, not specified as traumatic: Secondary | ICD-10-CM | POA: Diagnosis present

## 2019-06-02 DIAGNOSIS — E669 Obesity, unspecified: Secondary | ICD-10-CM | POA: Diagnosis not present

## 2019-06-02 DIAGNOSIS — Z79899 Other long term (current) drug therapy: Secondary | ICD-10-CM | POA: Diagnosis not present

## 2019-06-02 DIAGNOSIS — M170 Bilateral primary osteoarthritis of knee: Secondary | ICD-10-CM | POA: Diagnosis not present

## 2019-06-02 DIAGNOSIS — S46012A Strain of muscle(s) and tendon(s) of the rotator cuff of left shoulder, initial encounter: Secondary | ICD-10-CM | POA: Diagnosis not present

## 2019-06-02 DIAGNOSIS — M7522 Bicipital tendinitis, left shoulder: Secondary | ICD-10-CM | POA: Diagnosis not present

## 2019-06-02 LAB — BASIC METABOLIC PANEL
Anion gap: 8 (ref 5–15)
BUN: 14 mg/dL (ref 6–20)
CO2: 26 mmol/L (ref 22–32)
Calcium: 9 mg/dL (ref 8.9–10.3)
Chloride: 104 mmol/L (ref 98–111)
Creatinine, Ser: 0.86 mg/dL (ref 0.61–1.24)
GFR calc Af Amer: >60 mL/min (ref >60)
GFR calc non Af Amer: >60 mL/min (ref >60)
Glucose, Bld: 95 mg/dL (ref 70–99)
Potassium: 4.2 mmol/L (ref 3.5–5.1)
Sodium: 138 mmol/L (ref 135–145)

## 2019-06-02 LAB — CBC
HCT: 36.3 % — ABNORMAL LOW (ref 39.0–52.0)
Hemoglobin: 11.4 g/dL — ABNORMAL LOW (ref 13.0–17.0)
MCH: 22.4 pg — ABNORMAL LOW (ref 26.0–34.0)
MCHC: 31.4 g/dL (ref 30.0–36.0)
MCV: 71.2 fL — ABNORMAL LOW (ref 80.0–100.0)
Platelets: 322 10*3/uL (ref 150–400)
RBC: 5.1 MIL/uL (ref 4.22–5.81)
RDW: 15.2 % (ref 11.5–15.5)
WBC: 6 10*3/uL (ref 4.0–10.5)
nRBC: 0 % (ref 0.0–0.2)

## 2019-06-02 NOTE — Progress Notes (Signed)
   06/02/19 0945  OBSTRUCTIVE SLEEP APNEA  Have you ever been diagnosed with sleep apnea through a sleep study? No  Do you snore loudly (loud enough to be heard through closed doors)?  1  Do you often feel tired, fatigued, or sleepy during the daytime (such as falling asleep during driving or talking to someone)? 1  Has anyone observed you stop breathing during your sleep? 0  Do you have, or are you being treated for high blood pressure? 0  BMI more than 35 kg/m2? 1  Age > 50 (1-yes) 1  Neck circumference greater than:Male 16 inches or larger, Male 17inches or larger? 0  Male Gender (Yes=1) 1  Obstructive Sleep Apnea Score 5  Score 5 or greater  Results sent to PCP

## 2019-06-02 NOTE — Progress Notes (Signed)

## 2019-06-05 ENCOUNTER — Other Ambulatory Visit (HOSPITAL_COMMUNITY)
Admission: RE | Admit: 2019-06-05 | Discharge: 2019-06-05 | Disposition: A | Discharge status: Home / Self Care | Payer: BC Managed Care – PPO | Source: Ambulatory Visit | Attending: Orthopedic Surgery | Admitting: Orthopedic Surgery

## 2019-06-05 DIAGNOSIS — Z20822 Contact with and (suspected) exposure to covid-19: Secondary | ICD-10-CM | POA: Insufficient documentation

## 2019-06-05 DIAGNOSIS — Z01812 Encounter for preprocedural laboratory examination: Secondary | ICD-10-CM | POA: Insufficient documentation

## 2019-06-05 LAB — SARS CORONAVIRUS 2 (TAT 6-24 HRS): SARS Coronavirus 2: NEGATIVE

## 2019-06-07 ENCOUNTER — Encounter: Payer: Self-pay | Admitting: Surgical

## 2019-06-07 ENCOUNTER — Other Ambulatory Visit: Payer: Self-pay

## 2019-06-07 ENCOUNTER — Ambulatory Visit (INDEPENDENT_AMBULATORY_CARE_PROVIDER_SITE_OTHER): Payer: BC Managed Care – PPO | Admitting: Surgical

## 2019-06-07 DIAGNOSIS — M75122 Complete rotator cuff tear or rupture of left shoulder, not specified as traumatic: Secondary | ICD-10-CM

## 2019-06-07 NOTE — Progress Notes (Signed)
Patient presented for brace fitting prior to lower trapezius tendon transfer scheduled for June 08, 2019.  Patient's brace was adjusted so that patient's arm was in a position of 60 degrees of abduction and about 50 degrees of external rotation.  We will place this brace on him following the surgical procedure tomorrow.  He will follow-up 1 to 2 weeks after surgery for clinical check.

## 2019-06-08 ENCOUNTER — Ambulatory Visit (HOSPITAL_BASED_OUTPATIENT_CLINIC_OR_DEPARTMENT_OTHER)
Admission: RE | Admit: 2019-06-08 | Discharge: 2019-06-08 | Disposition: A | Discharge status: Home / Self Care | Payer: BC Managed Care – PPO | Source: Intra-hospital | Attending: Orthopedic Surgery | Admitting: Orthopedic Surgery

## 2019-06-08 ENCOUNTER — Ambulatory Visit (HOSPITAL_BASED_OUTPATIENT_CLINIC_OR_DEPARTMENT_OTHER): Payer: BC Managed Care – PPO | Admitting: Anesthesiology

## 2019-06-08 ENCOUNTER — Other Ambulatory Visit: Payer: Self-pay

## 2019-06-08 ENCOUNTER — Encounter (HOSPITAL_BASED_OUTPATIENT_CLINIC_OR_DEPARTMENT_OTHER)
Admission: RE | Disposition: A | Discharge status: Home / Self Care | Payer: Self-pay | Source: Intra-hospital | Attending: Orthopedic Surgery

## 2019-06-08 ENCOUNTER — Encounter (HOSPITAL_BASED_OUTPATIENT_CLINIC_OR_DEPARTMENT_OTHER): Payer: Self-pay | Admitting: Orthopedic Surgery

## 2019-06-08 DIAGNOSIS — M19042 Primary osteoarthritis, left hand: Secondary | ICD-10-CM | POA: Insufficient documentation

## 2019-06-08 DIAGNOSIS — S46012A Strain of muscle(s) and tendon(s) of the rotator cuff of left shoulder, initial encounter: Secondary | ICD-10-CM | POA: Insufficient documentation

## 2019-06-08 DIAGNOSIS — E669 Obesity, unspecified: Secondary | ICD-10-CM | POA: Insufficient documentation

## 2019-06-08 DIAGNOSIS — M170 Bilateral primary osteoarthritis of knee: Secondary | ICD-10-CM | POA: Insufficient documentation

## 2019-06-08 DIAGNOSIS — Z6838 Body mass index (BMI) 38.0-38.9, adult: Secondary | ICD-10-CM | POA: Insufficient documentation

## 2019-06-08 DIAGNOSIS — S46012D Strain of muscle(s) and tendon(s) of the rotator cuff of left shoulder, subsequent encounter: Secondary | ICD-10-CM

## 2019-06-08 DIAGNOSIS — M7522 Bicipital tendinitis, left shoulder: Secondary | ICD-10-CM

## 2019-06-08 DIAGNOSIS — Y939 Activity, unspecified: Secondary | ICD-10-CM | POA: Insufficient documentation

## 2019-06-08 DIAGNOSIS — M19041 Primary osteoarthritis, right hand: Secondary | ICD-10-CM | POA: Insufficient documentation

## 2019-06-08 DIAGNOSIS — S43432D Superior glenoid labrum lesion of left shoulder, subsequent encounter: Secondary | ICD-10-CM

## 2019-06-08 DIAGNOSIS — D649 Anemia, unspecified: Secondary | ICD-10-CM | POA: Insufficient documentation

## 2019-06-08 DIAGNOSIS — Z79899 Other long term (current) drug therapy: Secondary | ICD-10-CM | POA: Insufficient documentation

## 2019-06-08 HISTORY — DX: Unspecified osteoarthritis, unspecified site: M19.90

## 2019-06-08 HISTORY — PX: SHOULDER ARTHROSCOPY WITH SUBACROMIAL DECOMPRESSION, ROTATOR CUFF REPAIR AND BICEP TENDON REPAIR: SHX5687

## 2019-06-08 HISTORY — PX: ALLOGRAFT APPLICATION: SHX6404

## 2019-06-08 HISTORY — DX: Unspecified rotator cuff tear or rupture of left shoulder, not specified as traumatic: M75.102

## 2019-06-08 SURGERY — SHOULDER ARTHROSCOPY WITH SUBACROMIAL DECOMPRESSION, ROTATOR CUFF REPAIR AND BICEP TENDON REPAIR
Anesthesia: General | Site: Shoulder | Laterality: Left

## 2019-06-08 MED ORDER — OXYCODONE HCL 5 MG PO TABS: 5.0000 mg | ORAL_TABLET | ORAL | 0 refills | Status: DC | PRN

## 2019-06-08 MED ORDER — CEFAZOLIN SODIUM-DEXTROSE 2-4 GM/100ML-% IV SOLN
INTRAVENOUS | Status: AC
  Filled 2019-06-08: qty 100

## 2019-06-08 MED ORDER — PHENYLEPHRINE HCL (PRESSORS) 10 MG/ML IV SOLN
INTRAVENOUS | Status: AC
  Filled 2019-06-08: qty 4

## 2019-06-08 MED ORDER — ONDANSETRON HCL 4 MG/2ML IJ SOLN
INTRAMUSCULAR | Status: DC | PRN
  Administered 2019-06-08: 4 mg via INTRAVENOUS

## 2019-06-08 MED ORDER — LIDOCAINE HCL (CARDIAC) PF 100 MG/5ML IV SOSY
PREFILLED_SYRINGE | INTRAVENOUS | Status: DC | PRN
  Administered 2019-06-08: 80 mg via INTRAVENOUS

## 2019-06-08 MED ORDER — PHENYLEPHRINE HCL (PRESSORS) 10 MG/ML IV SOLN
INTRAVENOUS | Status: DC | PRN
  Administered 2019-06-08: 200 ug via INTRAVENOUS
  Administered 2019-06-08: 120 ug via INTRAVENOUS
  Administered 2019-06-08: 160 ug via INTRAVENOUS
  Administered 2019-06-08: 200 ug via INTRAVENOUS

## 2019-06-08 MED ORDER — CEFAZOLIN SODIUM-DEXTROSE 2-4 GM/100ML-% IV SOLN
2.0000 g | INTRAVENOUS | Status: AC
  Administered 2019-06-08 (×2): 2 g via INTRAVENOUS

## 2019-06-08 MED ORDER — PROPOFOL 10 MG/ML IV BOLUS
INTRAVENOUS | Status: AC
  Filled 2019-06-08: qty 20

## 2019-06-08 MED ORDER — LACTATED RINGERS IV SOLN: INTRAVENOUS | Status: DC | PRN

## 2019-06-08 MED ORDER — BUPIVACAINE-EPINEPHRINE (PF) 0.5% -1:200000 IJ SOLN
INTRAMUSCULAR | Status: DC | PRN
  Administered 2019-06-08 (×5): 3 mL via PERINEURAL

## 2019-06-08 MED ORDER — ROCURONIUM BROMIDE 100 MG/10ML IV SOLN
INTRAVENOUS | Status: DC | PRN
  Administered 2019-06-08: 60 mg via INTRAVENOUS
  Administered 2019-06-08: 20 mg via INTRAVENOUS
  Administered 2019-06-08: 10 mg via INTRAVENOUS
  Administered 2019-06-08 (×2): 20 mg via INTRAVENOUS

## 2019-06-08 MED ORDER — OXYCODONE HCL 5 MG/5ML PO SOLN: 5.0000 mg | Freq: Once | ORAL | Status: DC | PRN

## 2019-06-08 MED ORDER — FENTANYL CITRATE (PF) 100 MCG/2ML IJ SOLN
INTRAMUSCULAR | Status: DC | PRN
  Administered 2019-06-08 (×2): 50 ug via INTRAVENOUS
  Administered 2019-06-08: 100 ug via INTRAVENOUS

## 2019-06-08 MED ORDER — ROCURONIUM BROMIDE 10 MG/ML (PF) SYRINGE
PREFILLED_SYRINGE | INTRAVENOUS | Status: AC
  Filled 2019-06-08: qty 10

## 2019-06-08 MED ORDER — METHOCARBAMOL 500 MG PO TABS: 500.0000 mg | ORAL_TABLET | Freq: Three times a day (TID) | ORAL | 0 refills | Status: DC | PRN

## 2019-06-08 MED ORDER — FENTANYL CITRATE (PF) 100 MCG/2ML IJ SOLN
50.0000 ug | INTRAMUSCULAR | Status: DC | PRN
  Administered 2019-06-08: 100 ug via INTRAVENOUS

## 2019-06-08 MED ORDER — CHLORHEXIDINE GLUCONATE 4 % EX LIQD: 60.0000 mL | Freq: Once | CUTANEOUS | Status: DC

## 2019-06-08 MED ORDER — PROMETHAZINE HCL 25 MG/ML IJ SOLN
6.2500 mg | INTRAMUSCULAR | Status: DC | PRN
  Administered 2019-06-08: 6.25 mg via INTRAVENOUS

## 2019-06-08 MED ORDER — MIDAZOLAM HCL 2 MG/2ML IJ SOLN
INTRAMUSCULAR | Status: AC
  Filled 2019-06-08: qty 2

## 2019-06-08 MED ORDER — LIDOCAINE 2% (20 MG/ML) 5 ML SYRINGE
INTRAMUSCULAR | Status: AC
  Filled 2019-06-08: qty 5

## 2019-06-08 MED ORDER — ROCURONIUM BROMIDE 10 MG/ML (PF) SYRINGE
PREFILLED_SYRINGE | INTRAVENOUS | Status: AC
  Filled 2019-06-08: qty 20

## 2019-06-08 MED ORDER — LIDOCAINE-EPINEPHRINE 1 %-1:100000 IJ SOLN
INTRAMUSCULAR | Status: AC
  Filled 2019-06-08: qty 1

## 2019-06-08 MED ORDER — DEXAMETHASONE SODIUM PHOSPHATE 10 MG/ML IJ SOLN
INTRAMUSCULAR | Status: AC
  Filled 2019-06-08: qty 1

## 2019-06-08 MED ORDER — FENTANYL CITRATE (PF) 100 MCG/2ML IJ SOLN
INTRAMUSCULAR | Status: AC
  Filled 2019-06-08: qty 2

## 2019-06-08 MED ORDER — FENTANYL CITRATE (PF) 100 MCG/2ML IJ SOLN: 25.0000 ug | INTRAMUSCULAR | Status: DC | PRN

## 2019-06-08 MED ORDER — DEXAMETHASONE SODIUM PHOSPHATE 4 MG/ML IJ SOLN
INTRAMUSCULAR | Status: DC | PRN
  Administered 2019-06-08: 7 mg via INTRAVENOUS

## 2019-06-08 MED ORDER — ASPIRIN EC 81 MG PO TBEC: 81.0000 mg | DELAYED_RELEASE_TABLET | Freq: Every day | ORAL | 1 refills | Status: DC

## 2019-06-08 MED ORDER — EPINEPHRINE PF 1 MG/ML IJ SOLN
INTRAMUSCULAR | Status: AC
  Filled 2019-06-08: qty 1

## 2019-06-08 MED ORDER — PROMETHAZINE HCL 25 MG/ML IJ SOLN
INTRAMUSCULAR | Status: AC
  Filled 2019-06-08: qty 1

## 2019-06-08 MED ORDER — ONDANSETRON HCL 4 MG/2ML IJ SOLN
INTRAMUSCULAR | Status: AC
  Filled 2019-06-08: qty 2

## 2019-06-08 MED ORDER — SUGAMMADEX SODIUM 200 MG/2ML IV SOLN
INTRAVENOUS | Status: DC | PRN
  Administered 2019-06-08: 240 mg via INTRAVENOUS

## 2019-06-08 MED ORDER — CEFAZOLIN SODIUM 1 G IJ SOLR
INTRAMUSCULAR | Status: AC
  Filled 2019-06-08: qty 20

## 2019-06-08 MED ORDER — OXYCODONE HCL 5 MG PO TABS: 5.0000 mg | ORAL_TABLET | Freq: Once | ORAL | Status: DC | PRN

## 2019-06-08 MED ORDER — ACETAMINOPHEN 500 MG PO TABS
ORAL_TABLET | ORAL | Status: AC
  Filled 2019-06-08: qty 2

## 2019-06-08 MED ORDER — PROPOFOL 10 MG/ML IV BOLUS
INTRAVENOUS | Status: DC | PRN
  Administered 2019-06-08: 200 mg via INTRAVENOUS

## 2019-06-08 MED ORDER — ACETAMINOPHEN 500 MG PO TABS
1000.0000 mg | ORAL_TABLET | Freq: Once | ORAL | Status: AC
  Administered 2019-06-08: 11:00:00 1000 mg via ORAL

## 2019-06-08 MED ORDER — LACTATED RINGERS IV SOLN: INTRAVENOUS | Status: DC

## 2019-06-08 MED ORDER — MIDAZOLAM HCL 2 MG/2ML IJ SOLN
1.0000 mg | INTRAMUSCULAR | Status: DC | PRN
  Administered 2019-06-08: 11:00:00 2 mg via INTRAVENOUS

## 2019-06-08 MED ORDER — PHENYLEPHRINE HCL-NACL 10-0.9 MG/250ML-% IV SOLN
INTRAVENOUS | Status: DC | PRN
  Administered 2019-06-08: 50 ug/min via INTRAVENOUS

## 2019-06-08 MED ORDER — BUPIVACAINE LIPOSOME 1.3 % IJ SUSP
INTRAMUSCULAR | Status: DC | PRN
  Administered 2019-06-08 (×5): 2 mL via PERINEURAL

## 2019-06-08 MED ORDER — SUCCINYLCHOLINE CHLORIDE 20 MG/ML IJ SOLN
INTRAMUSCULAR | Status: DC | PRN
  Administered 2019-06-08: 160 mg via INTRAVENOUS

## 2019-06-08 MED ORDER — EPINEPHRINE PF 1 MG/ML IJ SOLN: INTRAMUSCULAR | Status: DC | PRN

## 2019-06-08 MED ORDER — BUPIVACAINE HCL (PF) 0.5 % IJ SOLN
INTRAMUSCULAR | Status: AC
  Filled 2019-06-08: qty 30

## 2019-06-08 SURGICAL SUPPLY — 112 items
AID PSTN UNV HD RSTRNT DISP (MISCELLANEOUS) ×1
ANCH SUT 2 19.1 W/TIGERTAPE (Anchor) ×1 IMPLANT
ANCH SUT FBRTK 1.3 2 TPE (Anchor) ×2 IMPLANT
ANCH SUT SWLK 19.1X4.75 (Anchor) ×5 IMPLANT
ANCHOR FBRTK 2.6 SUTURETAP 1.3 (Anchor) ×4 IMPLANT
ANCHOR SL BIO 4.75 W/TIGERTAPE (Anchor) ×2 IMPLANT
ANCHOR SUT 1.8 FBRTK KNTLS 2SU (Anchor) ×2 IMPLANT
ANCHOR SUT BIO SW 4.75X19.1 (Anchor) ×10 IMPLANT
BLADE EXCALIBUR 4.0MM X 13CM (MISCELLANEOUS) ×1
BLADE EXCALIBUR 4.0X13 (MISCELLANEOUS) ×1 IMPLANT
BLADE SHAVER BONE 5.0MM X 13CM (MISCELLANEOUS)
BLADE SHAVER BONE 5.0X13 (MISCELLANEOUS) IMPLANT
BLADE SURG 10 STRL SS (BLADE) ×2 IMPLANT
BLADE SURG 11 STRL SS (BLADE) ×2 IMPLANT
BLADE SURG 15 STRL LF DISP TIS (BLADE) IMPLANT
BLADE SURG 15 STRL SS (BLADE) ×15
BURR OVAL 8 FLU 5.0MM X 13CM (MISCELLANEOUS)
BURR OVAL 8 FLU 5.0X13 (MISCELLANEOUS) IMPLANT
CANNULA 5.75X71 LONG (CANNULA) IMPLANT
CANNULA TWIST IN 8.25X7CM (CANNULA) IMPLANT
CLEANER CAUTERY TIP 5X5 PAD (MISCELLANEOUS) IMPLANT
CLOSURE WOUND 1/2 X4 (GAUZE/BANDAGES/DRESSINGS) ×2
COVER BACK TABLE 60X90IN (DRAPES) ×2 IMPLANT
COVER SURGICAL LIGHT HANDLE (MISCELLANEOUS) ×2 IMPLANT
COVER WAND RF STERILE (DRAPES) IMPLANT
DECANTER SPIKE VIAL GLASS SM (MISCELLANEOUS) IMPLANT
DISSECTOR  3.8MM X 13CM (MISCELLANEOUS)
DISSECTOR 3.8MM X 13CM (MISCELLANEOUS) IMPLANT
DISSECTOR 4.0MM X 13CM (MISCELLANEOUS) ×1 IMPLANT
DRAPE HALF SHEET 70X43 (DRAPES) IMPLANT
DRAPE IMP U-DRAPE 54X76 (DRAPES) ×3 IMPLANT
DRAPE INCISE IOBAN 66X45 STRL (DRAPES) ×7 IMPLANT
DRAPE STERI 35X30 U-POUCH (DRAPES) ×3 IMPLANT
DRAPE U-SHAPE 47X51 STRL (DRAPES) ×12 IMPLANT
DRAPE U-SHAPE 76X120 STRL (DRAPES) ×6 IMPLANT
DRSG AQUACEL AG ADV 3.5X 6 (GAUZE/BANDAGES/DRESSINGS) ×5 IMPLANT
DURAPREP 26ML APPLICATOR (WOUND CARE) ×3 IMPLANT
DW OUTFLOW CASSETTE/TUBE SET (MISCELLANEOUS) ×3 IMPLANT
ELECT NEEDLE TIP 2.8 STRL (NEEDLE) ×3 IMPLANT
ELECT REM PT RETURN 9FT ADLT (ELECTROSURGICAL) ×3
ELECTRODE REM PT RTRN 9FT ADLT (ELECTROSURGICAL) ×1 IMPLANT
EXCALIBUR 3.8MM X 13CM (MISCELLANEOUS) IMPLANT
GAUZE SPONGE 4X4 12PLY STRL (GAUZE/BANDAGES/DRESSINGS) ×3 IMPLANT
GAUZE XEROFORM 1X8 LF (GAUZE/BANDAGES/DRESSINGS) ×3 IMPLANT
GLOVE BIO SURGEON STRL SZ 6.5 (GLOVE) ×1 IMPLANT
GLOVE BIO SURGEON STRL SZ7 (GLOVE) ×12 IMPLANT
GLOVE BIO SURGEONS STRL SZ 6.5 (GLOVE) ×1
GLOVE BIOGEL PI IND STRL 7.0 (GLOVE) ×3 IMPLANT
GLOVE BIOGEL PI IND STRL 8 (GLOVE) ×1 IMPLANT
GLOVE BIOGEL PI INDICATOR 7.0 (GLOVE) ×6
GLOVE BIOGEL PI INDICATOR 8 (GLOVE) ×2
GLOVE SURG ORTHO 8.0 STRL STRW (GLOVE) ×5 IMPLANT
GOWN STRL REUS W/ TWL LRG LVL3 (GOWN DISPOSABLE) ×2 IMPLANT
GOWN STRL REUS W/ TWL XL LVL3 (GOWN DISPOSABLE) ×1 IMPLANT
GOWN STRL REUS W/TWL LRG LVL3 (GOWN DISPOSABLE) ×6
GOWN STRL REUS W/TWL XL LVL3 (GOWN DISPOSABLE) ×3
GRAFT ACHILLES TENDON (Bone Implant) ×3 IMPLANT
KIT BIO-TENODESIS 3X8 DISP (MISCELLANEOUS)
KIT INSRT BABSR STRL DISP BTN (MISCELLANEOUS) IMPLANT
KIT STR SPEAR 1.8 FBRTK DISP (KITS) ×2 IMPLANT
MANIFOLD NEPTUNE II (INSTRUMENTS) ×3 IMPLANT
NDL SAFETY ECLIPSE 18X1.5 (NEEDLE) ×2 IMPLANT
NDL SCORPION MULTI FIRE (NEEDLE) IMPLANT
NDL SPNL 18GX3.5 QUINCKE PK (NEEDLE) IMPLANT
NDL SUT 6 .5 CRC .975X.05 MAYO (NEEDLE) IMPLANT
NEEDLE HYPO 18GX1.5 SHARP (NEEDLE) ×3
NEEDLE MAYO TAPER (NEEDLE) ×3
NEEDLE SCORPION MULTI FIRE (NEEDLE) IMPLANT
NEEDLE SPNL 18GX3.5 QUINCKE PK (NEEDLE) IMPLANT
NS IRRIG 1000ML POUR BTL (IV SOLUTION) IMPLANT
PACK ARTHROSCOPY DSU (CUSTOM PROCEDURE TRAY) ×3 IMPLANT
PACK BASIN DAY SURGERY FS (CUSTOM PROCEDURE TRAY) ×3 IMPLANT
PAD CLEANER CAUTERY TIP 5X5 (MISCELLANEOUS)
PENCIL SMOKE EVACUATOR (MISCELLANEOUS) ×3 IMPLANT
PORT APPOLLO RF 90DEGREE MULTI (SURGICAL WAND) ×3 IMPLANT
RESTRAINT HEAD UNIVERSAL NS (MISCELLANEOUS) ×3 IMPLANT
SLEEVE SCD COMPRESS KNEE MED (MISCELLANEOUS) ×3 IMPLANT
SLING ARM FOAM STRAP LRG (SOFTGOODS) IMPLANT
SPONGE LAP 4X18 RFD (DISPOSABLE) ×18 IMPLANT
SPONGE SURGIFOAM ABS GEL 12-7 (HEMOSTASIS) IMPLANT
STAPLER VISISTAT 35W (STAPLE) IMPLANT
STRIP CLOSURE SKIN 1/2X4 (GAUZE/BANDAGES/DRESSINGS) ×2 IMPLANT
SUCTION FRAZIER HANDLE 10FR (MISCELLANEOUS) ×2
SUCTION TUBE FRAZIER 10FR DISP (MISCELLANEOUS) ×1 IMPLANT
SUT BONE WAX W31G (SUTURE) IMPLANT
SUT ETHIBOND 2 OS 4 DA (SUTURE) IMPLANT
SUT ETHILON 3 0 PS 1 (SUTURE) ×6 IMPLANT
SUT FIBERWIRE #2 38 T-5 BLUE (SUTURE) ×6
SUT FIBERWIRE 2-0 18 17.9 3/8 (SUTURE)
SUT MNCRL AB 3-0 PS2 18 (SUTURE) IMPLANT
SUT MNCRL AB 3-0 PS2 27 (SUTURE) ×4 IMPLANT
SUT PDS AB 0 CT 36 (SUTURE) IMPLANT
SUT TICRON 1 T 12 (SUTURE) IMPLANT
SUT VIC AB 1 CT1 27 (SUTURE) ×9
SUT VIC AB 1 CT1 27XBRD ANBCTR (SUTURE) IMPLANT
SUT VIC AB 2-0 SH 27 (SUTURE)
SUT VIC AB 2-0 SH 27XBRD (SUTURE) IMPLANT
SUT VICRYL 0 UR6 27IN ABS (SUTURE) ×14 IMPLANT
SUT VICRYL 4-0 PS2 18IN ABS (SUTURE) ×1 IMPLANT
SUTURE FIBERWR #2 38 T-5 BLUE (SUTURE) IMPLANT
SUTURE FIBERWR 2-0 18 17.9 3/8 (SUTURE) IMPLANT
SUTURE TAPE 1.3 40 TPR END (SUTURE) IMPLANT
SUTURE TAPE 1.3 FIBERLOP 20 ST (SUTURE) IMPLANT
SUTURETAPE 1.3 40 TPR END (SUTURE) ×18
SUTURETAPE 1.3 FIBERLOOP 20 ST (SUTURE) ×3
SYR 5ML LL (SYRINGE) ×2 IMPLANT
TOWEL GREEN STERILE FF (TOWEL DISPOSABLE) ×3 IMPLANT
TRAY DSU PREP LF (CUSTOM PROCEDURE TRAY) ×3 IMPLANT
TUBE CONNECTING 20'X1/4 (TUBING) ×1
TUBE CONNECTING 20X1/4 (TUBING) ×2 IMPLANT
TUBING ARTHROSCOPY IRRIG 16FT (MISCELLANEOUS) ×3 IMPLANT
YANKAUER SUCT BULB TIP NO VENT (SUCTIONS) ×3 IMPLANT

## 2019-06-08 NOTE — Transfer of Care (Signed)
Immediate Anesthesia Transfer of Care Note  Patient: Henry Wolfe  Procedure(s) Performed: LEFT SHOULDER LOWER TRAPEZIUS TRANSFER WITH ALLOGRAFT, ARTHROSCOPY, BICEPS TENODESIS,  OPEN SUBSCAPULAR REPAIR (Left Shoulder) ALLOGRAFT APPLICATION left shoulder (Left Shoulder)  Patient Location: PACU  Anesthesia Type:GA combined with regional for post-op pain  Level of Consciousness: awake, alert , oriented, drowsy and patient cooperative  Airway & Oxygen Therapy: Patient Spontanous Breathing and Patient connected to nasal cannula oxygen  Post-op Assessment: Report given to RN and Post -op Vital signs reviewed and stable  Post vital signs: Reviewed and stable  Last Vitals:  Vitals Value Taken Time  BP 119/88 06/08/19 1640  Temp    Pulse 83 06/08/19 1641  Resp 20 06/08/19 1641  SpO2 95 % 06/08/19 1641  Vitals shown include unvalidated device data.  Last Pain:  Vitals:   06/08/19 1053  TempSrc: Temporal  PainSc: 2       Patients Stated Pain Goal: 1 (06/08/19 1053)  Complications: No apparent anesthesia complications

## 2019-06-08 NOTE — Anesthesia Postprocedure Evaluation (Signed)
Anesthesia Post Note  Patient: Henry Wolfe  Procedure(s) Performed: LEFT SHOULDER LOWER TRAPEZIUS TRANSFER WITH ALLOGRAFT, ARTHROSCOPY, BICEPS TENODESIS,  OPEN SUBSCAPULAR REPAIR (Left Shoulder) ALLOGRAFT APPLICATION left shoulder (Left Shoulder)     Patient location during evaluation: Phase II Anesthesia Type: General Level of consciousness: awake Pain management: pain level controlled Vital Signs Assessment: post-procedure vital signs reviewed and stable Respiratory status: spontaneous breathing Cardiovascular status: stable Postop Assessment: no headache Anesthetic complications: yes Anesthetic complication details: PONV   Last Vitals:  Vitals:   06/08/19 1700 06/08/19 1715  BP: (!) 127/91 (!) 142/91  Pulse: 85 84  Resp: 15 16  Temp:  36.9 C  SpO2: 98% 95%    Last Pain:  Vitals:   06/08/19 1715  TempSrc:   PainSc: 4    Pain Goal: Patients Stated Pain Goal: 3 (06/08/19 1715)                 Caren Macadam
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
## 2019-06-08 NOTE — Anesthesia Procedure Notes (Signed)
Performed by: Placida Cambre M, CRNA       

## 2019-06-08 NOTE — Discharge Instructions (Signed)
Post Anesthesia Home Care Instructions  Activity: Get plenty of rest for the remainder of the day. A responsible individual must stay with you for 24 hours following the procedure.  For the next 24 hours, DO NOT: -Drive a car -Operate machinery -Drink alcoholic beverages -Take any medication unless instructed by your physician -Make any legal decisions or sign important papers.  Meals: Start with liquid foods such as gelatin or soup. Progress to regular foods as tolerated. Avoid greasy, spicy, heavy foods. If nausea and/or vomiting occur, drink only clear liquids until the nausea and/or vomiting subsides. Call your physician if vomiting continues.  Special Instructions/Symptoms: Your throat may feel dry or sore from the anesthesia or the breathing tube placed in your throat during surgery. If this causes discomfort, gargle with warm salt water. The discomfort should disappear within 24 hours.  If you had a scopolamine patch placed behind your ear for the management of post- operative nausea and/or vomiting:  1. The medication in the patch is effective for 72 hours, after which it should be removed.  Wrap patch in a tissue and discard in the trash. Wash hands thoroughly with soap and water. 2. You may remove the patch earlier than 72 hours if you experience unpleasant side effects which may include dry mouth, dizziness or visual disturbances. 3. Avoid touching the patch. Wash your hands with soap and water after contact with the patch.      Regional Anesthesia Blocks  1. Numbness or the inability to move the "blocked" extremity may last from 3-48 hours after placement. The length of time depends on the medication injected and your individual response to the medication. If the numbness is not going away after 48 hours, call your surgeon.  2. The extremity that is blocked will need to be protected until the numbness is gone and the  Strength has returned. Because you cannot feel it, you  will need to take extra care to avoid injury. Because it may be weak, you may have difficulty moving it or using it. You may not know what position it is in without looking at it while the block is in effect.  3. For blocks in the legs and feet, returning to weight bearing and walking needs to be done carefully. You will need to wait until the numbness is entirely gone and the strength has returned. You should be able to move your leg and foot normally before you try and bear weight or walk. You will need someone to be with you when you first try to ensure you do not fall and possibly risk injury.  4. Bruising and tenderness at the needle site are common side effects and will resolve in a few days.  5. Persistent numbness or new problems with movement should be communicated to the surgeon or the [facility] ([phone])/ [facility] ([phone]).  Information for Discharge Teaching: EXPAREL (bupivacaine liposome injectable suspension)   Your surgeon or anesthesiologist gave you EXPAREL(bupivacaine) to help control your pain after surgery.   EXPAREL is a local anesthetic that provides pain relief by numbing the tissue around the surgical site.  EXPAREL is designed to release pain medication over time and can control pain for up to 72 hours.  Depending on how you respond to EXPAREL, you may require less pain medication during your recovery.  Possible side effects:  Temporary loss of sensation or ability to move in the area where bupivacaine was injected.  Nausea, vomiting, constipation  Rarely,   numbness and tingling in your mouth or lips, lightheadedness, or anxiety may occur.  Call your doctor right away if you think you may be experiencing any of these sensations, or if you have other questions regarding possible side effects.  Follow all other discharge instructions given to you by your surgeon or nurse. Eat a healthy diet and drink plenty of water or other  fluids.  If you return to the hospital for any reason within 96 hours following the administration of EXPAREL, it is important for health care providers to know that you have received this anesthetic. A teal colored band has been placed on your arm with the date, time and amount of EXPAREL you have received in order to alert and inform your health care providers. Please leave this armband in place for the full 96 hours following administration, and then you may remove the band. 

## 2019-06-08 NOTE — Progress Notes (Signed)
AssistedDr. Hatchett with left, ultrasound guided, interscalene  block. Side rails up, monitors on throughout procedure. See vital signs in flow sheet. Tolerated Procedure well.  
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
## 2019-06-08 NOTE — Brief Op Note (Signed)
   06/08/2019  4:45 PM  PATIENT:  Henry Wolfe  59 y.o. male  PRE-OPERATIVE DIAGNOSIS:  left massive rotator cuff tear  POST-OPERATIVE DIAGNOSIS:  Left shoulder rotator cuff tear  PROCEDURE:  Procedure(s): LEFT SHOULDER LOWER TRAPEZIUS TRANSFER WITH ALLOGRAFT, ARTHROSCOPY, BICEPS TENODESIS,  OPEN SUBSCAPULAR REPAIR ALLOGRAFT APPLICATION left shoulder Limited labral debridement SURGEON:  Surgeon(s): August Saucer, Corrie Mckusick, MD  ASSISTANT: magnant pa  ANESTHESIA:   general  EBL: 50 ml    Total I/O In: 2000 [I.V.:2000] Out: 50 [Blood:50]  BLOOD ADMINISTERED: none  DRAINS: none   LOCAL MEDICATIONS USED:  none  SPECIMEN:  No Specimen  COUNTS:  YES  TOURNIQUET:  * No tourniquets in log *  DICTATION: .Other Dictation: Dictation Number [id number]  PLAN OF CARE: Discharge to home after PACU  PATIENT DISPOSITION:  PACU - hemodynamically stable

## 2019-06-08 NOTE — Anesthesia Preprocedure Evaluation (Addendum)
Anesthesia Evaluation  Patient identified by MRN, date of birth, ID band Patient awake    Reviewed: Allergy & Precautions, NPO status , Patient's Chart, lab work & pertinent test results  History of Anesthesia Complications Negative for: history of anesthetic complications  Airway Mallampati: III       Dental  (+) Poor Dentition, Missing,    Pulmonary neg pulmonary ROS,    Pulmonary exam normal breath sounds clear to auscultation       Cardiovascular negative cardio ROS Normal cardiovascular exam Rhythm:Regular Rate:Normal     Neuro/Psych negative neurological ROS  negative psych ROS   GI/Hepatic negative GI ROS, Neg liver ROS,   Endo/Other  negative endocrine ROS  Renal/GU negative Renal ROS  negative genitourinary   Musculoskeletal  (+) Arthritis , Left rotator cuff repair   Abdominal (+) + obese,   Peds  Hematology  (+) Blood dyscrasia, anemia , Hgb 11.4   Anesthesia Other Findings Day of surgery medications reviewed with patient.  Reproductive/Obstetrics negative OB ROS                           Anesthesia Physical Anesthesia Plan  ASA: II  Anesthesia Plan: General   Post-op Pain Management: GA combined w/ Regional for post-op pain   Induction: Intravenous  PONV Risk Score and Plan: 2 and Treatment may vary due to age or medical condition, Ondansetron, Dexamethasone and Midazolam  Airway Management Planned: Oral ETT  Additional Equipment: None  Intra-op Plan:   Post-operative Plan: Extubation in OR  Informed Consent: I have reviewed the patients History and Physical, chart, labs and discussed the procedure including the risks, benefits and alternatives for the proposed anesthesia with the patient or authorized representative who has indicated his/her understanding and acceptance.     Dental advisory given  Plan Discussed with: CRNA  Anesthesia Plan Comments:         Anesthesia Quick Evaluation

## 2019-06-08 NOTE — Anesthesia Procedure Notes (Signed)
Anesthesia Regional Block: Interscalene brachial plexus block   Pre-Anesthetic Checklist: ,, timeout performed, Correct Patient, Correct Site, Correct Laterality, Correct Procedure, Correct Position, site marked, Risks and benefits discussed,  Surgical consent,  Pre-op evaluation,  At surgeon's request and post-op pain management  Laterality: Left and Upper  Prep: chloraprep       Needles:  Injection technique: Single-shot  Needle Type: Echogenic Stimulator Needle     Needle Length: 5cm  Needle Gauge: 21   Needle insertion depth: 1.5 cm   Additional Needles:   Procedures:,,,, ultrasound used (permanent image in chart),,,,  Narrative:  Start time: 06/08/2019 11:30 AM End time: 06/08/2019 11:40 AM Injection made incrementally with aspirations every 5 mL.  Performed by: Personally  Anesthesiologist: Leilani Able, MD

## 2019-06-08 NOTE — Anesthesia Procedure Notes (Signed)
Procedure Name: Intubation Date/Time: 06/08/2019 11:48 AM Performed by: Verita Lamb, CRNA Pre-anesthesia Checklist: Patient identified, Emergency Drugs available, Suction available and Patient being monitored Patient Re-evaluated:Patient Re-evaluated prior to induction Oxygen Delivery Method: Circle system utilized Preoxygenation: Pre-oxygenation with 100% oxygen Induction Type: IV induction Ventilation: Mask ventilation without difficulty Laryngoscope Size: Mac and 4 Grade View: Grade II Tube type: Oral Tube size: 8.0 mm Number of attempts: 1 Airway Equipment and Method: Stylet and Oral airway Placement Confirmation: ETT inserted through vocal cords under direct vision,  positive ETCO2 and breath sounds checked- equal and bilateral Secured at: 22 cm Tube secured with: Tape Dental Injury: Teeth and Oropharynx as per pre-operative assessment

## 2019-06-09 ENCOUNTER — Encounter: Payer: Self-pay | Admitting: *Deleted

## 2019-06-09 MED ORDER — SODIUM CHLORIDE 0.9 % IR SOLN
Status: DC | PRN
  Administered 2019-06-08: 3000 mL

## 2019-06-14 ENCOUNTER — Telehealth: Payer: Self-pay | Admitting: Orthopedic Surgery

## 2019-06-14 ENCOUNTER — Other Ambulatory Visit: Payer: Self-pay | Admitting: Surgical

## 2019-06-14 MED ORDER — OXYCODONE HCL 5 MG PO TABS: 5.0000 mg | ORAL_TABLET | ORAL | 0 refills | Status: DC | PRN

## 2019-06-14 NOTE — Telephone Encounter (Signed)
Patient called.   He wants a refill on his pain meds but doesn't know if he's allowed to get it before his appointment.   Call back number: [phone]

## 2019-06-14 NOTE — Telephone Encounter (Signed)
Please advise. Thanks.  

## 2019-06-15 NOTE — Telephone Encounter (Signed)
IC LMVM advising patient done.  

## 2019-06-16 ENCOUNTER — Ambulatory Visit (INDEPENDENT_AMBULATORY_CARE_PROVIDER_SITE_OTHER): Payer: BC Managed Care – PPO | Admitting: Orthopedic Surgery

## 2019-06-16 ENCOUNTER — Other Ambulatory Visit: Payer: Self-pay

## 2019-06-16 ENCOUNTER — Encounter: Payer: Self-pay | Admitting: Orthopedic Surgery

## 2019-06-16 VITALS — Ht 68.75 in | Wt 245.0 lb

## 2019-06-16 DIAGNOSIS — M75122 Complete rotator cuff tear or rupture of left shoulder, not specified as traumatic: Secondary | ICD-10-CM

## 2019-06-16 NOTE — Progress Notes (Signed)
   Post-Op Visit Note   Patient: Henry Wolfe           Date of Birth: [birth year]           MRN: [id number] Visit Date: 06/16/2019 PCP: Avon Gully, MD   Assessment & Plan:  Chief Complaint:  Chief Complaint  Patient presents with  . Left Shoulder - Routine Post Op    06/08/2019 Left Shoulder Lower Trapezius Transfer with Allograft, Arthroscopy, Biceps Tenodesis, Open Subscapular Repair   Visit Diagnoses:  1. Complete tear of left rotator cuff, unspecified whether traumatic     Plan: Nori is a patient is now a week out left shoulder lower trap transfer with allograft.  Biceps tenodesis and subscap repair.  Patient's been doing well in the brace.  Incisions intact.  Deltoid fires.  Plan is to continue in the brace for now come back in about 3-1/2 4 weeks and will take him out of the brace start range of motion passively with his abduction splint and start him in therapy at that time as well.  Motor sensory function to the hand is intact.  All incisions intact.  Follow-Up Instructions: Return in about 4 weeks (around 07/14/2019).   Orders:  No orders of the defined types were placed in this encounter.  No orders of the defined types were placed in this encounter.   Imaging: No results found.  PMFS History: Patient Active Problem List   Diagnosis Date Noted  . Food allergy 11/10/2017  . Perennial allergic rhinitis 11/10/2017  . Tendonitis of upper biceps tendon of left shoulder 11/10/2017  . 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 Effusion of knee joint right 12/20/2013  . Primary osteoarthritis of right knee 12/20/2013  . Osteoarthritis of right knee 03/02/2013  . Knee pain, bilateral 03/02/2013  . LOWER LEG, ARTHRITIS, DEGEN./OSTEO 08/19/2007   Past Medical History:  Diagnosis Date  . Arthritis    bil knees, fingers bil  . Left rotator cuff tear     Family History  Family history unknown: Yes    Past Surgical History:  Procedure Laterality Date  . ALLOGRAFT  APPLICATION Left 06/08/2019   Procedure: ALLOGRAFT APPLICATION left shoulder;  Surgeon: Cammy Copa, MD;  Location: [facility];  Service: Orthopedics;  Laterality: Left;  . KNEE ARTHROSCOPY    . SHOULDER ARTHROSCOPY WITH SUBACROMIAL DECOMPRESSION, ROTATOR CUFF REPAIR AND BICEP TENDON REPAIR Left 06/08/2019   Procedure: LEFT SHOULDER LOWER TRAPEZIUS TRANSFER WITH ALLOGRAFT, ARTHROSCOPY, BICEPS TENODESIS,  OPEN SUBSCAPULAR REPAIR;  Surgeon: Cammy Copa, MD;  Location: [facility];  Service: Orthopedics;  Laterality: Left;   Social History   Occupational History  . Not on file  Tobacco Use  . Smoking status: Never Smoker  . Smokeless tobacco: Never Used  Substance and Sexual Activity  . Alcohol use: Not Currently  . Drug use: No  . Sexual activity: Not on file

## 2019-06-23 ENCOUNTER — Telehealth: Payer: Self-pay | Admitting: Orthopedic Surgery

## 2019-06-23 ENCOUNTER — Other Ambulatory Visit: Payer: Self-pay | Admitting: Surgical

## 2019-06-23 MED ORDER — METHOCARBAMOL 500 MG PO TABS: 500.0000 mg | ORAL_TABLET | Freq: Three times a day (TID) | ORAL | 0 refills | Status: DC | PRN

## 2019-06-23 MED ORDER — OXYCODONE HCL 5 MG PO TABS: 5.0000 mg | ORAL_TABLET | ORAL | 0 refills | Status: DC | PRN

## 2019-06-23 NOTE — Telephone Encounter (Signed)
Please advise 

## 2019-06-23 NOTE — Telephone Encounter (Signed)
submitted

## 2019-06-23 NOTE — Telephone Encounter (Signed)
I called patient and advised. 

## 2019-06-23 NOTE — Telephone Encounter (Signed)
Patient called. He would like a refill on Robaxin and Oxycodone. His call back number is [phone]

## 2019-07-09 ENCOUNTER — Other Ambulatory Visit: Payer: Self-pay

## 2019-07-09 ENCOUNTER — Encounter: Payer: Self-pay | Admitting: Orthopedic Surgery

## 2019-07-09 ENCOUNTER — Ambulatory Visit (INDEPENDENT_AMBULATORY_CARE_PROVIDER_SITE_OTHER): Payer: BC Managed Care – PPO | Admitting: Orthopedic Surgery

## 2019-07-09 DIAGNOSIS — M75122 Complete rotator cuff tear or rupture of left shoulder, not specified as traumatic: Secondary | ICD-10-CM

## 2019-07-09 MED ORDER — OXYCODONE HCL 5 MG PO TABS: 5.0000 mg | ORAL_TABLET | Freq: Four times a day (QID) | ORAL | 0 refills | Status: DC | PRN

## 2019-07-09 MED ORDER — METHOCARBAMOL 500 MG PO TABS: 500.0000 mg | ORAL_TABLET | Freq: Three times a day (TID) | ORAL | 0 refills | Status: DC | PRN

## 2019-07-09 MED ORDER — ASPIRIN EC 81 MG PO TBEC: 81.0000 mg | DELAYED_RELEASE_TABLET | Freq: Every day | ORAL | 1 refills | Status: DC

## 2019-07-09 MED ORDER — CELECOXIB 100 MG PO CAPS: 100.0000 mg | ORAL_CAPSULE | Freq: Two times a day (BID) | ORAL | 0 refills | Status: DC

## 2019-07-09 NOTE — Progress Notes (Signed)
Post-Op Visit Note   Patient: Henry Wolfe           Date of Birth: 1960/07/22           MRN: [id number] Visit Date: 07/09/2019 PCP: Rosita Fire, MD   Assessment & Plan:  Chief Complaint:  Chief Complaint  Patient presents with  . Left Shoulder - Pain   Visit Diagnoses:  1. Complete tear of left rotator cuff, unspecified whether traumatic     Plan: Patient is a 59 year old male who presents s/p left shoulder lower trapezius tendon transfer on 06/08/2019.  Patient is about 4 weeks postop.  He notes that he is doing well overall and has adjusted to life with his postop brace.  He has been compliant with wearing the brace as directed previously.  He has been taking oxycodone for pain, Robaxin for muscle spasms, aspirin for DVT prophylaxis.  He notes that his pain is improving overall.  He has difficulty sleeping at night due to discomfort in the sleeping position.  On exam his incisions are healing well.  He has excellent left shoulder range of motion at this point with about 90 degrees of abduction, greater than 90 degrees of forward flexion, about 30 degrees of external rotation.  He has excellent strength with retraction of that left arm as well as external rotation of the left shoulder.  Plan to discontinue the wedge portion of the postop brace.  He will wear the abduction pillow over the next 2 weeks and use the CPM machine 3 times a day for 1 hour each time.  He will start in physical therapy upstairs sometime next week.  A postop physical therapy protocol was provided to the patient.  He will bring this to physical therapy.  Plan to follow-up in 4 weeks for clinical recheck.  Also prescribed Celebrex to assist with patient's pain.  Follow-Up Instructions: No follow-ups on file.   Orders:  Orders Placed This Encounter  Procedures  . Ambulatory referral to Physical Therapy   Meds ordered this encounter  Medications  . methocarbamol (ROBAXIN) 500 MG tablet    Sig: Take 1 tablet  (500 mg total) by mouth every 8 (eight) hours as needed for muscle spasms.    Dispense:  30 tablet    Refill:  0  . aspirin EC 81 MG tablet    Sig: Take 1 tablet (81 mg total) by mouth daily.    Dispense:  30 tablet    Refill:  1  . oxyCODONE (ROXICODONE) 5 MG immediate release tablet    Sig: Take 1 tablet (5 mg total) by mouth every 6 (six) hours as needed.    Dispense:  35 tablet    Refill:  0  . celecoxib (CELEBREX) 100 MG capsule    Sig: Take 1 capsule (100 mg total) by mouth 2 (two) times daily.    Dispense:  60 capsule    Refill:  0    Imaging: No results found.  PMFS History: Patient Active Problem List   Diagnosis Date Noted  . Food allergy 11/10/2017  . Perennial allergic rhinitis 11/10/2017  . Tendonitis of upper biceps tendon of left shoulder 11/10/2017  . 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 Effusion of knee joint right 12/20/2013  . Primary osteoarthritis of right knee 12/20/2013  . Osteoarthritis of right knee 03/02/2013  . Knee pain, bilateral 03/02/2013  . LOWER LEG, ARTHRITIS, DEGEN./OSTEO 08/19/2007   Past Medical History:  Diagnosis Date  . Arthritis  bil knees, fingers bil  . Left rotator cuff tear     Family History  Family history unknown: Yes    Past Surgical History:  Procedure Laterality Date  . ALLOGRAFT APPLICATION Left 06/08/2019   Procedure: ALLOGRAFT APPLICATION left shoulder;  Surgeon: Cammy Copa, MD;  Location: [facility];  Service: Orthopedics;  Laterality: Left;  . KNEE ARTHROSCOPY    . SHOULDER ARTHROSCOPY WITH SUBACROMIAL DECOMPRESSION, ROTATOR CUFF REPAIR AND BICEP TENDON REPAIR Left 06/08/2019   Procedure: LEFT SHOULDER LOWER TRAPEZIUS TRANSFER WITH ALLOGRAFT, ARTHROSCOPY, BICEPS TENODESIS,  OPEN SUBSCAPULAR REPAIR;  Surgeon: Cammy Copa, MD;  Location: [facility];  Service: Orthopedics;  Laterality: Left;   Social History   Occupational History  . Not on file  Tobacco Use  . Smoking  status: Never Smoker  . Smokeless tobacco: Never Used  Substance and Sexual Activity  . Alcohol use: Not Currently  . Drug use: No  . Sexual activity: Not on file

## 2019-07-13 ENCOUNTER — Encounter: Payer: Self-pay | Admitting: Rehabilitative and Restorative Service Providers"

## 2019-07-13 ENCOUNTER — Ambulatory Visit (INDEPENDENT_AMBULATORY_CARE_PROVIDER_SITE_OTHER): Payer: BC Managed Care – PPO | Admitting: Rehabilitative and Restorative Service Providers"

## 2019-07-13 ENCOUNTER — Other Ambulatory Visit: Payer: Self-pay

## 2019-07-13 DIAGNOSIS — G8929 Other chronic pain: Secondary | ICD-10-CM

## 2019-07-13 DIAGNOSIS — M25512 Pain in left shoulder: Secondary | ICD-10-CM

## 2019-07-13 DIAGNOSIS — M6281 Muscle weakness (generalized): Secondary | ICD-10-CM | POA: Diagnosis not present

## 2019-07-13 NOTE — Therapy (Signed)
[facility] Physical Therapy [address] [place], [place], [address] Phone: [phone]   Fax:  [phone]  Physical Therapy Evaluation  Patient Details  Name: Henry Wolfe MRN: [id number] Date of Birth: 11/21/1960 Referring Provider (PT): Dr. Marlou Sa   Encounter Date: 07/13/2019  PT End of Session - 07/13/19 0912    Visit Number  1    Number of Visits  24    Date for PT Re-Evaluation  10/05/19    PT Start Time  0812    PT Stop Time  0844    PT Time Calculation (min)  32 min    Activity Tolerance  Patient tolerated treatment well    Behavior During Therapy  [facility] for tasks assessed/performed       Past Medical History:  Diagnosis Date  . Arthritis    bil knees, fingers bil  . Left rotator cuff tear     Past Surgical History:  Procedure Laterality Date  . ALLOGRAFT APPLICATION Left 09/05/8183   Procedure: ALLOGRAFT APPLICATION left shoulder;  Surgeon: Meredith Pel, MD;  Location: [place];  Service: Orthopedics;  Laterality: Left;  . KNEE ARTHROSCOPY    . SHOULDER ARTHROSCOPY WITH SUBACROMIAL DECOMPRESSION, ROTATOR CUFF REPAIR AND BICEP TENDON REPAIR Left 06/08/2019   Procedure: LEFT SHOULDER LOWER TRAPEZIUS TRANSFER WITH ALLOGRAFT, ARTHROSCOPY, BICEPS TENODESIS,  OPEN SUBSCAPULAR REPAIR;  Surgeon: Meredith Pel, MD;  Location: [place];  Service: Orthopedics;  Laterality: Left;    There were no vitals filed for this visit.   Subjective Assessment - 07/13/19 0817    Subjective  Comes to clinic 4 weeks s/p lower trap transfer for L shoulder repair following fall on shoulder in March of 2020.  Surgery was performed 06/08/2019.    Currently in Pain?  Yes    Pain Score  6     Pain Location  Shoulder    Pain Orientation  Left    Pain Descriptors / Indicators  Aching;Sharp    Pain Type  Chronic pain;Surgical pain    Pain Onset  More than a month ago    Pain Frequency  Intermittent    Aggravating Factors   Any  movement of arm at this time including reaching, sleeping on arm, lifting (limited by protocol).    Pain Relieving Factors  Pain medicine, resting.    Effect of Pain on Daily Activities  Severe limitation.    Multiple Pain Sites  No         [facility] PT Assessment - 07/13/19 0001      Assessment   Medical Diagnosis  L shoulder pain s/p lower trapezius tendon transfer    Referring Provider (PT)  Dr. Marlou Sa    Onset Date/Surgical Date  06/08/19    Hand Dominance  Right    Prior Therapy  Not on shoulder      Precautions   Precaution Comments  See scanned protocol   Primary limitation: Full flexion forced, IR, add, extension     Balance Screen   Has the patient fallen in the past 6 months  No    Has the patient had a decrease in activity level because of a fear of falling?   No    Is the patient reluctant to leave their home because of a fear of falling?   No      Home Social worker  Private residence    Home Access  Stairs to enter      Prior  Function   Vocation  Full time Chiropractor   Leisure  Self care, yard work.      Cognition   Overall Cognitive Status  Within Functional Limits for tasks assessed      ROM / Strength   AROM / PROM / Strength  AROM;PROM;Strength      AROM   Overall AROM Comments  No AROM tested on LUE at this time due to protocol.   RUE WFL     PROM   Overall PROM Comments  Lt GH joint mobility WFL at this time in mid range movment    PROM Assessment Site  Shoulder    Right/Left Shoulder  Left    Left Shoulder Flexion  125 Degrees    Left Shoulder ABduction  100 Degrees    Left Shoulder External Rotation  30 Degrees      Strength   Overall Strength Comments  L shoulder MMT not tested due to protocol. BUE elbow, wrist MMT 5/5 throughout    Strength Assessment Site  Shoulder    Right/Left Shoulder  Left;Right    Right Shoulder Flexion  5/5    Right Shoulder ABduction  5/5    Right Shoulder Internal Rotation   5/5    Right Shoulder External Rotation  5/5                Objective measurements completed on examination: See above findings.      [facility] Adult PT Treatment/Exercise - 07/13/19 0001      Exercises   Exercises  Shoulder      Shoulder Exercises: Seated   Other Seated Exercises  scapular retraction 3 x 10 2 second hold      Shoulder Exercises: ROM/Strengthening   Other ROM/Strengthening Exercises  supine PROM Lt gh joint 3 x 10 2 second hold      Shoulder Exercises: Stretch   External Rotation Stretch  Other (comment)   supine wand ER PROM 3 x 10 2 second hold     Manual Therapy   Manual Therapy  Joint mobilization   g2-g3 inferior Lt GH jt mobs            PT Education - 07/13/19 0912    Education Details  HEP given, education on protocol.  Advised no pain c HEP, just gentle PROM    Person(s) Educated  Patient    Methods  Explanation;Verbal cues;Handout    Comprehension  Verbalized understanding;Returned demonstration       PT Short Term Goals - 07/13/19 0907      PT SHORT TERM GOAL #1   Title  Patient will demonstrate independent use of home exercise program to maintain progress from in clinic treatments.    Time  2    Period  Weeks    Status  New    Target Date  07/27/19        PT Long Term Goals - 07/13/19 0908      PT LONG TERM GOAL #1   Title  Patient will demonstrate/report pain at worst less than or equal to 2/10 to facilitate minimal limitation in daily activity secondary to pain symptoms.    Time  12    Period  Weeks    Status  New    Target Date  10/05/19      PT LONG TERM GOAL #2   Title  Patient will demonstrate independent use of home exercise program to facilitate ability to maintain/progress functional gains from skilled  physical therapy services.    Time  12    Period  Weeks    Status  New    Target Date  10/05/19      PT LONG TERM GOAL #3   Title  Patient will demonstrate return to work/recreational activity at previous  level of function without limitations secondary due to condition    Time  12    Period  Weeks    Status  New    Target Date  10/05/19      PT LONG TERM GOAL #4   Title  Pt. will demonstrate L shoulder AROM WFL s symptoms to facilitate usual daily self care, reaching, functional activity at PLOF.    Time  12    Period  Weeks    Status  New    Target Date  10/05/19      PT LONG TERM GOAL #5   Title  Pt. will demonstrate LUE MMT 5/5 throughout to facilitate usual lifting, carrying for daily and work activity at [employer].    Time  12    Period  Weeks    Status  New    Target Date  10/05/19             Plan - 07/13/19 0900    Clinical Impression Statement  Patient is a 59 y.o. male who comes to clinic with complaints of L shoulder pain s/p surgery with mobility, strength and movement coordination deficits that impair their ability to perform usual daily and recreational functional activities without increase difficulty/symptoms at this time.  Patient to benefit from skilled PT services to address impairments and limitations to improve to previous level of function without restriction secondary to condition.    Personal Factors and Comorbidities  Profession    Examination-Activity Limitations  Bathing;Reach Overhead;Carry;Dressing;Hygiene/Grooming;Lift    Examination-Participation Restrictions  Development worker, community;Yard Work;Other    Stability/Clinical Decision Making  Stable/Uncomplicated    Clinical Decision Making  Low    Rehab Potential  Good    PT Frequency  2x / week    PT Duration  12 weeks    PT Treatment/Interventions  ADLs/Self Care Home Management;Electrical Stimulation;Moist Heat;Iontophoresis 4mg /ml Dexamethasone;Functional mobility training;Therapeutic activities;Therapeutic exercise;Neuromuscular re-education;Manual techniques;Patient/family education;Passive range of motion;Dry needling;Joint Manipulations;Spinal Manipulations;Vasopneumatic Device;Taping     PT Next Visit Plan  Review HEP, progress passive mobility, initiate deltoid isometrics    PT Home Exercise Plan     Consulted and Agree with Plan of Care  Patient       Patient will benefit from skilled therapeutic intervention in order to improve the following deficits and impairments:  Decreased range of motion, Impaired UE functional use, Pain, Decreased activity tolerance, Decreased strength, Decreased mobility  Visit Diagnosis: Chronic left shoulder pain - Plan: PT plan of care cert/re-cert  Muscle weakness (generalized) - Plan: PT plan of care cert/re-cert     Problem List Patient Active Problem List   Diagnosis Date Noted  . Food allergy 11/10/2017  . Perennial allergic rhinitis 11/10/2017  . Tendonitis of upper biceps tendon of left shoulder 11/10/2017  . 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 Effusion of knee joint right 12/20/2013  . Primary osteoarthritis of right knee 12/20/2013  . Osteoarthritis of right knee 03/02/2013  . Knee pain, bilateral 03/02/2013  . LOWER LEG, ARTHRITIS, DEGEN./OSTEO 08/19/2007    08/21/2007, PT, DPT, OCS, ATC 07/13/19  10:17 AM    [facility] Physical Therapy [address] [place], [place], [place] Phone: [phone]   Fax:  [phone]  Name: GEARALD STONEBRAKER MRN: [id number] Date of Birth: Jan 29, 1961

## 2019-07-13 NOTE — Patient Instructions (Signed)
Access Code: [email or web address] URL: [email or web address] Date: 07/13/2019 Prepared by: Chyrel Masson  Exercises Seated Scapular Retraction - 10 reps - 2 sets - 5 hold - 2x daily - 7x weekly Supine Shoulder Flexion AAROM with Hands Clasped - 10 reps - 2 sets - 5 hold - 2x daily - 7x weekly Supine Shoulder External Rotation with Dowel - 10 reps - 3 sets - 5 hold - 2x daily - 7x weekly

## 2019-07-16 ENCOUNTER — Encounter: Payer: BC Managed Care – PPO | Admitting: Rehabilitative and Restorative Service Providers"

## 2019-07-19 ENCOUNTER — Telehealth: Payer: Self-pay | Admitting: Physical Therapy

## 2019-07-19 ENCOUNTER — Encounter: Payer: BC Managed Care – PPO | Admitting: Physical Therapy

## 2019-07-19 NOTE — Telephone Encounter (Signed)
LVM for pt due to no show.  Requested call back.  Clarita Crane, PT, DPT 07/19/19 9:08 AM

## 2019-07-21 ENCOUNTER — Other Ambulatory Visit: Payer: Self-pay

## 2019-07-21 ENCOUNTER — Other Ambulatory Visit: Payer: Self-pay | Admitting: Surgical

## 2019-07-21 ENCOUNTER — Telehealth: Payer: Self-pay | Admitting: Orthopedic Surgery

## 2019-07-21 ENCOUNTER — Ambulatory Visit (INDEPENDENT_AMBULATORY_CARE_PROVIDER_SITE_OTHER): Payer: BC Managed Care – PPO | Admitting: Rehabilitative and Restorative Service Providers"

## 2019-07-21 ENCOUNTER — Encounter: Payer: Self-pay | Admitting: Rehabilitative and Restorative Service Providers"

## 2019-07-21 DIAGNOSIS — G8929 Other chronic pain: Secondary | ICD-10-CM | POA: Diagnosis not present

## 2019-07-21 DIAGNOSIS — M25512 Pain in left shoulder: Secondary | ICD-10-CM | POA: Diagnosis not present

## 2019-07-21 DIAGNOSIS — M6281 Muscle weakness (generalized): Secondary | ICD-10-CM | POA: Diagnosis not present

## 2019-07-21 MED ORDER — OXYCODONE HCL 5 MG PO TABS: 5.0000 mg | ORAL_TABLET | Freq: Three times a day (TID) | ORAL | 0 refills | Status: DC | PRN

## 2019-07-21 MED ORDER — ASPIRIN EC 81 MG PO TBEC: 81.0000 mg | DELAYED_RELEASE_TABLET | Freq: Every day | ORAL | 1 refills | Status: DC

## 2019-07-21 MED ORDER — CELECOXIB 100 MG PO CAPS: 100.0000 mg | ORAL_CAPSULE | Freq: Two times a day (BID) | ORAL | 0 refills | Status: DC

## 2019-07-21 MED ORDER — METHOCARBAMOL 500 MG PO TABS: 500.0000 mg | ORAL_TABLET | Freq: Three times a day (TID) | ORAL | 0 refills | Status: DC | PRN

## 2019-07-21 NOTE — Therapy (Signed)
[facility] Physical Therapy [address] [place], [place], [address] Phone: [phone]   Fax:  [phone]  Physical Therapy Treatment  Patient Details  Name: Henry Wolfe MRN: [id number] Date of Birth: 02-21-1961 Referring Provider (PT): Dr. Marlou Sa   Encounter Date: 07/21/2019  PT End of Session - 07/21/19 0919    Visit Number  2    Number of Visits  24    Date for PT Re-Evaluation  10/05/19    PT Start Time  0854    PT Stop Time  0932    PT Time Calculation (min)  38 min    Activity Tolerance  Patient tolerated treatment well    Behavior During Therapy  [facility] for tasks assessed/performed       Past Medical History:  Diagnosis Date  . Arthritis    bil knees, fingers bil  . Left rotator cuff tear     Past Surgical History:  Procedure Laterality Date  . ALLOGRAFT APPLICATION Left 06/11/7586   Procedure: ALLOGRAFT APPLICATION left shoulder;  Surgeon: Meredith Pel, MD;  Location: [place];  Service: Orthopedics;  Laterality: Left;  . KNEE ARTHROSCOPY    . SHOULDER ARTHROSCOPY WITH SUBACROMIAL DECOMPRESSION, ROTATOR CUFF REPAIR AND BICEP TENDON REPAIR Left 06/08/2019   Procedure: LEFT SHOULDER LOWER TRAPEZIUS TRANSFER WITH ALLOGRAFT, ARTHROSCOPY, BICEPS TENODESIS,  OPEN SUBSCAPULAR REPAIR;  Surgeon: Meredith Pel, MD;  Location: [place];  Service: Orthopedics;  Laterality: Left;    There were no vitals filed for this visit.  Subjective Assessment - 07/21/19 0856    Subjective  Pt. stated feeling numbness in arm, some mild pain 2/10.    Currently in Pain?  Yes    Pain Location  Shoulder    Pain Orientation  Left    Pain Onset  More than a month ago                       [facility] Adult PT Treatment/Exercise - 07/21/19 0001      Shoulder Exercises: Seated   Other Seated Exercises  scapular retraction 3 x 10 2 second hold    Other Seated Exercises  table slides scaption, abd 3 x 10      Shoulder Exercises: ROM/Strengthening   Other ROM/Strengthening Exercises  supine PROM Lt gh joint 3 x 10 2 second hold      Shoulder Exercises: Stretch   External Rotation Stretch  Other (comment)      Manual Therapy   Manual Therapy  Joint mobilization             PT Education - 07/21/19 0857    Education Details  HEP, cues for no pain during activity.       PT Short Term Goals - 07/13/19 0907      PT SHORT TERM GOAL #1   Title  Patient will demonstrate independent use of home exercise program to maintain progress from in clinic treatments.    Time  2    Period  Weeks    Status  New    Target Date  07/27/19        PT Long Term Goals - 07/13/19 0908      PT LONG TERM GOAL #1   Title  Patient will demonstrate/report pain at worst less than or equal to 2/10 to facilitate minimal limitation in daily activity secondary to pain symptoms.    Time  12    Period  Weeks  Status  New    Target Date  10/05/19      PT LONG TERM GOAL #2   Title  Patient will demonstrate independent use of home exercise program to facilitate ability to maintain/progress functional gains from skilled physical therapy services.    Time  12    Period  Weeks    Status  New    Target Date  10/05/19      PT LONG TERM GOAL #3   Title  Patient will demonstrate return to work/recreational activity at previous level of function without limitations secondary due to condition    Time  12    Period  Weeks    Status  New    Target Date  10/05/19      PT LONG TERM GOAL #4   Title  Pt. will demonstrate L shoulder AROM WFL s symptoms to facilitate usual daily self care, reaching, functional activity at PLOF.    Time  12    Period  Weeks    Status  New    Target Date  10/05/19      PT LONG TERM GOAL #5   Title  Pt. will demonstrate LUE MMT 5/5 throughout to facilitate usual lifting, carrying for daily and work activity at [employer].    Time  12    Period  Weeks    Status  New    Target Date   10/05/19            Plan - 07/21/19 0916    Clinical Impression Statement  Pt. demonstrated mild complaints of painful arc in passive mobility, most likely due to muscle guarding.  Passive mobility at this time not limited due to joint mobility restriction, more due to clincal protocol due to surgery.    Personal Factors and Comorbidities  Profession    Examination-Activity Limitations  Bathing;Reach Overhead;Carry;Dressing;Hygiene/Grooming;Lift    Examination-Participation Restrictions  Development worker, community;Yard Work;Other    Stability/Clinical Decision Making  Stable/Uncomplicated    Rehab Potential  Good    PT Frequency  2x / week    PT Duration  12 weeks    PT Treatment/Interventions  ADLs/Self Care Home Management;Electrical Stimulation;Moist Heat;Iontophoresis 4mg /ml Dexamethasone;Functional mobility training;Therapeutic activities;Therapeutic exercise;Neuromuscular re-education;Manual techniques;Patient/family education;Passive range of motion;Dry needling;Joint Manipulations;Spinal Manipulations;Vasopneumatic Device;Taping    PT Next Visit Plan  Review HEP, progress passive mobility, initiate deltoid isometrics    PT Home Exercise Plan     Consulted and Agree with Plan of Care  Patient       Patient will benefit from skilled therapeutic intervention in order to improve the following deficits and impairments:  Decreased range of motion, Impaired UE functional use, Pain, Decreased activity tolerance, Decreased strength, Decreased mobility  Visit Diagnosis: Chronic left shoulder pain  Muscle weakness (generalized)     Problem List Patient Active Problem List   Diagnosis Date Noted  . Food allergy 11/10/2017  . Perennial allergic rhinitis 11/10/2017  . Tendonitis of upper biceps tendon of left shoulder 11/10/2017  . 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 Effusion of knee joint right 12/20/2013  . Primary osteoarthritis of right knee 12/20/2013  .  Osteoarthritis of right knee 03/02/2013  . Knee pain, bilateral 03/02/2013  . LOWER LEG, ARTHRITIS, DEGEN./OSTEO 08/19/2007   08/21/2007, PT, DPT, OCS, ATC 07/21/19  9:35 AM      [facility] Physical Therapy [address] [place], [place], [place] Phone: [phone]   Fax:  [phone]  Name: Henry Wolfe MRN: [id number] Pod Date of  Birth: January 23, 1961

## 2019-07-21 NOTE — Telephone Encounter (Signed)
Patient requesting refills on all medications on file from Dr. Diamantina [place] office. Please send to pharmacy on file. Patient phone number is [phone].

## 2019-07-21 NOTE — Telephone Encounter (Signed)
Please advise. Thanks.  

## 2019-07-26 ENCOUNTER — Ambulatory Visit (INDEPENDENT_AMBULATORY_CARE_PROVIDER_SITE_OTHER): Payer: BC Managed Care – PPO | Admitting: Rehabilitative and Restorative Service Providers"

## 2019-07-26 ENCOUNTER — Encounter: Payer: Self-pay | Admitting: Rehabilitative and Restorative Service Providers"

## 2019-07-26 ENCOUNTER — Other Ambulatory Visit: Payer: Self-pay

## 2019-07-26 DIAGNOSIS — M6281 Muscle weakness (generalized): Secondary | ICD-10-CM

## 2019-07-26 DIAGNOSIS — M25512 Pain in left shoulder: Secondary | ICD-10-CM | POA: Diagnosis not present

## 2019-07-26 DIAGNOSIS — G8929 Other chronic pain: Secondary | ICD-10-CM

## 2019-07-26 NOTE — Patient Instructions (Signed)
Access Code: [email or web address] URL: [email or web address] Date: 07/26/2019 Prepared by: Chyrel Masson  Exercises Seated Scapular Retraction - 10 reps - 2 sets - 5 hold - 2x daily - 7x weekly Supine Shoulder Flexion Extension AAROM with Dowel - 10 reps - 3 sets - 5 hold - 2x daily - 7x weekly Supine Shoulder External Rotation with Dowel - 10 reps - 3 sets - 5 hold - 2x daily - 7x weekly Standing Single Arm Elbow Flexion with Resistance - 10 reps - 3 sets - 1x daily - 7x weekly Standing Elbow Extension with Self-Anchored Resistance - 10 reps - 3 sets - 1x daily - 7x weekly

## 2019-07-26 NOTE — Therapy (Signed)
[facility] Physical Therapy [address] [place], [place], [address] Phone: [phone]   Fax:  [phone]  Physical Therapy Treatment  Patient Details  Name: Henry Wolfe MRN: [id number] Date of Birth: 1960/10/30 Referring Provider (PT): Dr. Marlou Sa   Encounter Date: 07/26/2019  PT End of Session - 07/26/19 0914    Visit Number  3    Number of Visits  24    Date for PT Re-Evaluation  10/05/19    PT Start Time  0852    PT Stop Time  0931    PT Time Calculation (min)  39 min    Activity Tolerance  Patient tolerated treatment well    Behavior During Therapy  [facility] for tasks assessed/performed       Past Medical History:  Diagnosis Date  . Arthritis    bil knees, fingers bil  . Left rotator cuff tear     Past Surgical History:  Procedure Laterality Date  . ALLOGRAFT APPLICATION Left 06/10/8414   Procedure: ALLOGRAFT APPLICATION left shoulder;  Surgeon: Meredith Pel, MD;  Location: [place];  Service: Orthopedics;  Laterality: Left;  . KNEE ARTHROSCOPY    . SHOULDER ARTHROSCOPY WITH SUBACROMIAL DECOMPRESSION, ROTATOR CUFF REPAIR AND BICEP TENDON REPAIR Left 06/08/2019   Procedure: LEFT SHOULDER LOWER TRAPEZIUS TRANSFER WITH ALLOGRAFT, ARTHROSCOPY, BICEPS TENODESIS,  OPEN SUBSCAPULAR REPAIR;  Surgeon: Meredith Pel, MD;  Location: [place];  Service: Orthopedics;  Laterality: Left;    There were no vitals filed for this visit.  Subjective Assessment - 07/26/19 0854    Subjective  Pt. stated no pain upon arrrival today.  Occasional pain over weekend.    Currently in Pain?  No/denies    Pain Score  0-No pain    Pain Location  Shoulder    Pain Orientation  Left    Pain Onset  More than a month ago                       [facility] Adult PT Treatment/Exercise - 07/26/19 0001      Shoulder Exercises: Supine   Flexion  AROM;Both   3 x 10     Shoulder Exercises: Seated   Other Seated Exercises   scapular retraction 3 x 10 2 second hold    Other Seated Exercises  green band elbow flexion, ext 3 x 10 each LUE      Shoulder Exercises: ROM/Strengthening   UBE (Upper Arm Bike)  L1 forward 4 mins    Other ROM/Strengthening Exercises  AAROM with wand for flexion in supine 3 x 10      Shoulder Exercises: Stretch   External Rotation Stretch  Other (comment)      Manual Therapy   Manual Therapy  Joint mobilization             PT Education - 07/26/19 6063    Education Details  HEP review c new additional HEP cues.    Person(s) Educated  Patient    Methods  Explanation;Demonstration;Verbal cues    Comprehension  Verbalized understanding;Returned demonstration       PT Short Term Goals - 07/13/19 0907      PT SHORT TERM GOAL #1   Title  Patient will demonstrate independent use of home exercise program to maintain progress from in clinic treatments.    Time  2    Period  Weeks    Status  New    Target Date  07/27/19  PT Long Term Goals - 07/13/19 0908      PT LONG TERM GOAL #1   Title  Patient will demonstrate/report pain at worst less than or equal to 2/10 to facilitate minimal limitation in daily activity secondary to pain symptoms.    Time  12    Period  Weeks    Status  New    Target Date  10/05/19      PT LONG TERM GOAL #2   Title  Patient will demonstrate independent use of home exercise program to facilitate ability to maintain/progress functional gains from skilled physical therapy services.    Time  12    Period  Weeks    Status  New    Target Date  10/05/19      PT LONG TERM GOAL #3   Title  Patient will demonstrate return to work/recreational activity at previous level of function without limitations secondary due to condition    Time  12    Period  Weeks    Status  New    Target Date  10/05/19      PT LONG TERM GOAL #4   Title  Pt. will demonstrate L shoulder AROM WFL s symptoms to facilitate usual daily self care, reaching, functional  activity at PLOF.    Time  12    Period  Weeks    Status  New    Target Date  10/05/19      PT LONG TERM GOAL #5   Title  Pt. will demonstrate LUE MMT 5/5 throughout to facilitate usual lifting, carrying for daily and work activity at [employer].    Time  12    Period  Weeks    Status  New    Target Date  10/05/19            Plan - 07/26/19 0913    Clinical Impression Statement  Initiation of AROM, AAROM of Lt shoulder performed adequately today with minimal complaints in movement.  Continued cues given to avoid any symptoms during ROM end ranges.    Personal Factors and Comorbidities  Profession    Examination-Activity Limitations  Bathing;Reach Overhead;Carry;Dressing;Hygiene/Grooming;Lift    Examination-Participation Restrictions  Development worker, community;Yard Work;Other    Stability/Clinical Decision Making  Stable/Uncomplicated    Rehab Potential  Good    PT Frequency  2x / week    PT Duration  12 weeks    PT Treatment/Interventions  ADLs/Self Care Home Management;Electrical Stimulation;Moist Heat;Iontophoresis 4mg /ml Dexamethasone;Functional mobility training;Therapeutic activities;Therapeutic exercise;Neuromuscular re-education;Manual techniques;Patient/family education;Passive range of motion;Dry needling;Joint Manipulations;Spinal Manipulations;Vasopneumatic Device;Taping    PT Next Visit Plan  Continue to progress AAROM, AROM.    PT Home Exercise Plan     Consulted and Agree with Plan of Care  Patient       Patient will benefit from skilled therapeutic intervention in order to improve the following deficits and impairments:  Decreased range of motion, Impaired UE functional use, Pain, Decreased activity tolerance, Decreased strength, Decreased mobility  Visit Diagnosis: Chronic left shoulder pain  Muscle weakness (generalized)     Problem List Patient Active Problem List   Diagnosis Date Noted  . Food allergy 11/10/2017  . Perennial allergic  rhinitis 11/10/2017  . Tendonitis of upper biceps tendon of left shoulder 11/10/2017  . 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 Effusion of knee joint right 12/20/2013  . Primary osteoarthritis of right knee 12/20/2013  . Osteoarthritis of right knee 03/02/2013  . Knee pain, bilateral 03/02/2013  . LOWER LEG, ARTHRITIS,  DEGEN./OSTEO 08/19/2007    Chyrel Masson, PT, DPT, OCS, ATC 07/26/19  9:24 AM    [facility] Physical Therapy [address] [place], [place], [address] Phone: [phone]   Fax:  [phone]  Name: Henry Wolfe MRN: [id number] Date of Birth: 1960-09-09

## 2019-07-28 ENCOUNTER — Encounter: Payer: Self-pay | Admitting: Rehabilitative and Restorative Service Providers"

## 2019-07-28 ENCOUNTER — Telehealth: Payer: Self-pay | Admitting: Rehabilitative and Restorative Service Providers"

## 2019-07-28 ENCOUNTER — Encounter: Payer: BC Managed Care – PPO | Admitting: Rehabilitative and Restorative Service Providers"

## 2019-07-28 ENCOUNTER — Ambulatory Visit (INDEPENDENT_AMBULATORY_CARE_PROVIDER_SITE_OTHER): Payer: BC Managed Care – PPO | Admitting: Rehabilitative and Restorative Service Providers"

## 2019-07-28 ENCOUNTER — Other Ambulatory Visit: Payer: Self-pay

## 2019-07-28 DIAGNOSIS — M25512 Pain in left shoulder: Secondary | ICD-10-CM

## 2019-07-28 DIAGNOSIS — M6281 Muscle weakness (generalized): Secondary | ICD-10-CM

## 2019-07-28 DIAGNOSIS — G8929 Other chronic pain: Secondary | ICD-10-CM

## 2019-07-28 NOTE — Therapy (Signed)
[facility] Physical Therapy [address] [place], [place], [address] Phone: [phone]   Fax:  [phone]  Physical Therapy Treatment  Patient Details  Name: Henry Wolfe MRN: [id number] Date of Birth: 1961/04/26 Referring Provider (PT): Dr. Marlou Sa   Encounter Date: 07/28/2019  PT End of Session - 07/28/19 1522    Visit Number  4    Number of Visits  24    Date for PT Re-Evaluation  10/05/19    PT Start Time  9678    PT Stop Time  1535    PT Time Calculation (min)  39 min    Activity Tolerance  Patient tolerated treatment well    Behavior During Therapy  [facility] for tasks assessed/performed       Past Medical History:  Diagnosis Date  . Arthritis    bil knees, fingers bil  . Left rotator cuff tear     Past Surgical History:  Procedure Laterality Date  . ALLOGRAFT APPLICATION Left 02/03/8100   Procedure: ALLOGRAFT APPLICATION left shoulder;  Surgeon: Meredith Pel, MD;  Location: [place];  Service: Orthopedics;  Laterality: Left;  . KNEE ARTHROSCOPY    . SHOULDER ARTHROSCOPY WITH SUBACROMIAL DECOMPRESSION, ROTATOR CUFF REPAIR AND BICEP TENDON REPAIR Left 06/08/2019   Procedure: LEFT SHOULDER LOWER TRAPEZIUS TRANSFER WITH ALLOGRAFT, ARTHROSCOPY, BICEPS TENODESIS,  OPEN SUBSCAPULAR REPAIR;  Surgeon: Meredith Pel, MD;  Location: [place];  Service: Orthopedics;  Laterality: Left;    There were no vitals filed for this visit.  Subjective Assessment - 07/28/19 1459    Subjective  Pt. stated no pain today upon arrival.  Pt. stated arm feel asleep earlier but hasn't happened before.    Currently in Pain?  No/denies    Pain Score  0-No pain    Pain Location  Shoulder    Pain Orientation  Left    Pain Onset  More than a month ago                       [facility] Adult PT Treatment/Exercise - 07/28/19 0001      Shoulder Exercises: Supine   Flexion  AROM;Both   3 x 10     Shoulder Exercises:  Seated   Other Seated Exercises  supine 90 deg flexion circles clockwise, counter clockwise 1 min x 2 each way    Other Seated Exercises  green band elbow flexion, ext 3 x 15 each LUE      Shoulder Exercises: Sidelying   External Rotation  AROM;Left   3 x 10 c bolster   ABduction  AROM;Left   3 x 10 c bolster     Shoulder Exercises: Standing   Row  Strengthening;AROM   3x 10 green band     Shoulder Exercises: ROM/Strengthening   UBE (Upper Arm Bike)  L1 forward/reverse 3 mins each way    Other ROM/Strengthening Exercises  AAROM with wand for flexion in supine 3 x 10      Manual Therapy   Manual Therapy  Joint mobilization    Joint Mobilization  g2-g3             PT Education - 07/28/19 1512    Education Details  Cues for intervention.    Person(s) Educated  Patient    Methods  Explanation;Demonstration    Comprehension  Verbalized understanding;Returned demonstration       PT Short Term Goals - 07/28/19 1512      PT  SHORT TERM GOAL #1   Title  Patient will demonstrate independent use of home exercise program to maintain progress from in clinic treatments.    Baseline  07/28/2019:  Showing knowledge of HEP c occasional cues required.    Time  2    Period  Weeks    Status  Partially Met    Target Date  08/11/19        PT Long Term Goals - 07/13/19 0908      PT LONG TERM GOAL #1   Title  Patient will demonstrate/report pain at worst less than or equal to 2/10 to facilitate minimal limitation in daily activity secondary to pain symptoms.    Time  12    Period  Weeks    Status  New    Target Date  10/05/19      PT LONG TERM GOAL #2   Title  Patient will demonstrate independent use of home exercise program to facilitate ability to maintain/progress functional gains from skilled physical therapy services.    Time  12    Period  Weeks    Status  New    Target Date  10/05/19      PT LONG TERM GOAL #3   Title  Patient will demonstrate return to  work/recreational activity at previous level of function without limitations secondary due to condition    Time  12    Period  Weeks    Status  New    Target Date  10/05/19      PT LONG TERM GOAL #4   Title  Pt. will demonstrate L shoulder AROM WFL s symptoms to facilitate usual daily self care, reaching, functional activity at PLOF.    Time  12    Period  Weeks    Status  New    Target Date  10/05/19      PT LONG TERM GOAL #5   Title  Pt. will demonstrate LUE MMT 5/5 throughout to facilitate usual lifting, carrying for daily and work activity at [employer].    Time  12    Period  Weeks    Status  New    Target Date  10/05/19              Patient will benefit from skilled therapeutic intervention in order to improve the following deficits and impairments:     Visit Diagnosis: Chronic left shoulder pain  Muscle weakness (generalized)     Problem List Patient Active Problem List   Diagnosis Date Noted  . Food allergy 11/10/2017  . Perennial allergic rhinitis 11/10/2017  . Tendonitis of upper biceps tendon of left shoulder 11/10/2017  . 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 Effusion of knee joint right 12/20/2013  . Primary osteoarthritis of right knee 12/20/2013  . Osteoarthritis of right knee 03/02/2013  . Knee pain, bilateral 03/02/2013  . LOWER LEG, ARTHRITIS, DEGEN./OSTEO 08/19/2007   Scot Jun, PT, DPT, OCS, ATC 07/28/19  3:34 PM    [facility] Physical Therapy [address] [place], [place], [address] Phone: [phone]   Fax:  [phone]  Name: Henry Wolfe MRN: [id number] Date of Birth: September 29, 1960

## 2019-07-28 NOTE — Telephone Encounter (Signed)
Pt. Did not show for appointment this morning.  Called him and he said he overslept.  I offered him a later appointment today in the afternoon and he confirmed he would come at 2:45 today.  Chyrel Masson, PT, DPT, OCS, ATC 07/28/19  9:13 AM

## 2019-08-02 ENCOUNTER — Telehealth: Payer: Self-pay | Admitting: Rehabilitative and Restorative Service Providers"

## 2019-08-02 ENCOUNTER — Encounter: Payer: BC Managed Care – PPO | Admitting: Rehabilitative and Restorative Service Providers"

## 2019-08-02 NOTE — Telephone Encounter (Signed)
Called and left voice message for Pt. Regarding missed appointment at 8:45 am.   Chyrel Masson, PT, DPT, OCS, ATC 08/02/19  9:05 AM

## 2019-08-04 ENCOUNTER — Other Ambulatory Visit: Payer: Self-pay

## 2019-08-04 ENCOUNTER — Encounter: Payer: Self-pay | Admitting: Physical Therapy

## 2019-08-04 ENCOUNTER — Ambulatory Visit (INDEPENDENT_AMBULATORY_CARE_PROVIDER_SITE_OTHER): Payer: BC Managed Care – PPO | Admitting: Orthopedic Surgery

## 2019-08-04 ENCOUNTER — Ambulatory Visit (INDEPENDENT_AMBULATORY_CARE_PROVIDER_SITE_OTHER): Payer: BC Managed Care – PPO | Admitting: Physical Therapy

## 2019-08-04 VITALS — BP 122/83 | HR 86 | Temp 98.6°F | Ht 68.5 in | Wt 262.0 lb

## 2019-08-04 DIAGNOSIS — G8929 Other chronic pain: Secondary | ICD-10-CM

## 2019-08-04 DIAGNOSIS — M25461 Effusion, right knee: Secondary | ICD-10-CM

## 2019-08-04 DIAGNOSIS — M6281 Muscle weakness (generalized): Secondary | ICD-10-CM | POA: Diagnosis not present

## 2019-08-04 DIAGNOSIS — M25512 Pain in left shoulder: Secondary | ICD-10-CM | POA: Diagnosis not present

## 2019-08-04 NOTE — Progress Notes (Signed)
Chief Complaint  Patient presents with  . Follow-up    Recheck on right knee    Patient requests a night right knee  Recovering from shoulder surgery doing well  Aspirate right knee injection  Encounter Diagnosis  Name Primary?  . Effusion of knee joint right Yes    Procedure note injection and aspiration right knee joint  Verbal consent was obtained to aspirate and inject the right knee joint   Timeout was completed to confirm the site of aspiration and injection  An 18-gauge needle was used to aspirate the knee joint from a suprapatellar lateral approach.  The medications used were 40 mg of Depo-Medrol and 1% lidocaine 3 cc  Anesthesia was provided by ethyl chloride and the skin was prepped with alcohol.  After cleaning the skin with alcohol an 18-gauge needle was used to aspirate the right knee joint.  We obtained 20  cc of fluid clear yellow   We follow this by injection of 40 mg of Depo-Medrol and 3 cc 1% lidocaine.  There were no complications. A sterile bandage was applied.

## 2019-08-04 NOTE — Therapy (Signed)
[facility] Physical Therapy [address] [place], [place], [address] Phone: [phone]   Fax:  [phone]  Physical Therapy Treatment  Patient Details  Name: Henry Wolfe MRN: [id number] Date of Birth: [birth year] Referring Provider (PT): Dr. August Saucer   Encounter Date: 08/04/2019  PT End of Session - 08/04/19 0858    Visit Number  5    Number of Visits  24    Date for PT Re-Evaluation  10/05/19    PT Start Time  0855    PT Stop Time  0930    PT Time Calculation (min)  35 min    Activity Tolerance  Patient tolerated treatment well    Behavior During Therapy  [facility] for tasks assessed/performed       Past Medical History:  Diagnosis Date  . Arthritis    bil knees, fingers bil  . Left rotator cuff tear     Past Surgical History:  Procedure Laterality Date  . ALLOGRAFT APPLICATION Left 06/08/2019   Procedure: ALLOGRAFT APPLICATION left shoulder;  Surgeon: Cammy Copa, MD;  Location: [facility];  Service: Orthopedics;  Laterality: Left;  . KNEE ARTHROSCOPY    . SHOULDER ARTHROSCOPY WITH SUBACROMIAL DECOMPRESSION, ROTATOR CUFF REPAIR AND BICEP TENDON REPAIR Left 06/08/2019   Procedure: LEFT SHOULDER LOWER TRAPEZIUS TRANSFER WITH ALLOGRAFT, ARTHROSCOPY, BICEPS TENODESIS,  OPEN SUBSCAPULAR REPAIR;  Surgeon: Cammy Copa, MD;  Location: [facility];  Service: Orthopedics;  Laterality: Left;    There were no vitals filed for this visit.  Subjective Assessment - 08/04/19 0857    Subjective  Pt arriving to therapy reporting 1/10 pain in his L shoulder. Pt wearing resting sling.    Currently in Pain?  Yes    Pain Score  1     Pain Orientation  Left    Pain Descriptors / Indicators  Aching    Pain Type  Surgical pain    Pain Onset  More than a month ago    Pain Frequency  Intermittent                       [facility] Adult PT Treatment/Exercise - 08/04/19 0001      Shoulder Exercises: Supine   Flexion   AROM;Both   3 x 10   Other Supine Exercises  serratus punches x 15 reps with 2# weight, clockwise and counter clockwise circles    Other Supine Exercises  supine flexion using 1lb      Shoulder Exercises: Seated   Other Seated Exercises  green band elbow flexion, ext 3 x 15 each LUE      Shoulder Exercises: Sidelying   External Rotation  AROM;Left   3 x 10 c bolster   ABduction  AROM;Left   3 x 10 c bolster     Shoulder Exercises: Standing   Row  Strengthening;AROM   3x 10 green band     Shoulder Exercises: Pulleys   Flexion  2 minutes    Scaption  2 minutes      Shoulder Exercises: ROM/Strengthening   UBE (Upper Arm Bike)  L1.5  forward/reverse 2 mins each way               PT Short Term Goals - 08/04/19 0902      PT SHORT TERM GOAL #1   Title  Patient will demonstrate independent use of home exercise program to maintain progress from in clinic treatments.    Baseline  07/28/2019:  Showing knowledge of HEP c occasional cues required.    Time  2    Period  Weeks    Status  On-going    Target Date  08/11/19      PT SHORT TERM GOAL #2   Title  Pt will report at least 25% reduction in R UE radicular sxs to assist with work duties    Baseline  unmet    Time  3    Period  Weeks    Status  On-going      PT SHORT TERM GOAL #3   Title  Pt will report ability to lift L UE overhead with </= 3/10 L shoulder pain referral (along supraspinatus/lateral Deltoid referral) to perform iADLs    Baseline  6/10    Period  Weeks    Status  On-going        PT Long Term Goals - 07/13/19 7829      PT LONG TERM GOAL #1   Title  Patient will demonstrate/report pain at worst less than or equal to 2/10 to facilitate minimal limitation in daily activity secondary to pain symptoms.    Time  12    Period  Weeks    Status  New    Target Date  10/05/19      PT LONG TERM GOAL #2   Title  Patient will demonstrate independent use of home exercise program to facilitate ability to  maintain/progress functional gains from skilled physical therapy services.    Time  12    Period  Weeks    Status  New    Target Date  10/05/19      PT LONG TERM GOAL #3   Title  Patient will demonstrate return to work/recreational activity at previous level of function without limitations secondary due to condition    Time  12    Period  Weeks    Status  New    Target Date  10/05/19      PT LONG TERM GOAL #4   Title  Pt. will demonstrate L shoulder AROM WFL s symptoms to facilitate usual daily self care, reaching, functional activity at PLOF.    Time  12    Period  Weeks    Status  New    Target Date  10/05/19      PT LONG TERM GOAL #5   Title  Pt. will demonstrate LUE MMT 5/5 throughout to facilitate usual lifting, carrying for daily and work activity at [employer].    Time  12    Period  Weeks    Status  New    Target Date  10/05/19            Plan - 08/04/19 0859    Clinical Impression Statement  Pt arriving almost 10 minutes late to therapy. Pt reporting 1/10 pain in L shoulder. Pt tolreating all exercises well. Continue skilled PT.    Personal Factors and Comorbidities  Profession    Examination-Activity Limitations  Bathing;Reach Overhead;Carry;Dressing;Hygiene/Grooming;Lift    Examination-Participation Restrictions  Investment banker, operational;Yard Work;Other    Stability/Clinical Decision Making  Stable/Uncomplicated    Rehab Potential  Good    PT Frequency  2x / week    PT Duration  12 weeks    PT Treatment/Interventions  ADLs/Self Care Home Management;Electrical Stimulation;Moist Heat;Iontophoresis 4mg /ml Dexamethasone;Functional mobility training;Therapeutic activities;Therapeutic exercise;Neuromuscular re-education;Manual techniques;Patient/family education;Passive range of motion;Dry needling;Joint Manipulations;Spinal Manipulations;Vasopneumatic Device;Taping    PT Next Visit Plan  Continue to progress AAROM, AROM.  PT Home Exercise Plan  [id number]     Consulted and Agree with Plan of Care  Patient       Patient will benefit from skilled therapeutic intervention in order to improve the following deficits and impairments:  Decreased range of motion, Impaired UE functional use, Pain, Decreased activity tolerance, Decreased strength, Decreased mobility  Visit Diagnosis: Chronic left shoulder pain  Muscle weakness (generalized)     Problem List Patient Active Problem List   Diagnosis Date Noted  . Food allergy 11/10/2017  . Perennial allergic rhinitis 11/10/2017  . Tendonitis of upper biceps tendon of left shoulder 11/10/2017  . 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 Effusion of knee joint right 12/20/2013  . Primary osteoarthritis of right knee 12/20/2013  . Osteoarthritis of right knee 03/02/2013  . Knee pain, bilateral 03/02/2013  . LOWER LEG, ARTHRITIS, DEGEN./OSTEO 08/19/2007    Sharmon Leyden, PT 08/04/2019, 9:17 AM  [facility] Physical Therapy [address] [place], [place], [address] Phone: [phone]   Fax:  [phone]  Name: Henry Wolfe MRN: [id number] Date of Birth: August 24, 1960

## 2019-08-05 ENCOUNTER — Ambulatory Visit (INDEPENDENT_AMBULATORY_CARE_PROVIDER_SITE_OTHER): Payer: BC Managed Care – PPO | Admitting: Orthopedic Surgery

## 2019-08-05 DIAGNOSIS — M75122 Complete rotator cuff tear or rupture of left shoulder, not specified as traumatic: Secondary | ICD-10-CM

## 2019-08-06 ENCOUNTER — Encounter: Payer: Self-pay | Admitting: Orthopedic Surgery

## 2019-08-06 MED ORDER — HYDROCODONE-ACETAMINOPHEN 5-325 MG PO TABS: 1.0000 | ORAL_TABLET | Freq: Three times a day (TID) | ORAL | 0 refills | Status: DC | PRN

## 2019-08-06 NOTE — Progress Notes (Signed)
   Post-Op Visit Note   Patient: Henry Wolfe           Date of Birth: Apr 09, 1961           MRN: [id number] Visit Date: 08/05/2019 PCP: Avon Gully, MD   Assessment & Plan:  Chief Complaint:  Chief Complaint  Patient presents with  . Left Shoulder - Follow-up   Visit Diagnoses:  1. Complete tear of left rotator cuff, unspecified whether traumatic     Plan: Patient is a 59 year old male who presents s/p left shoulder lower trapezius tendon transfer on 06/08/2019.  Patient notes that pain is doing well and improving.  He is able to sleep okay.  He is going to physical therapy upstairs in this facility 2 times per week where they are working on active range of motion as well as strengthening.  They just darted strengthening in the past couple days.  Patient has excellent active abduction and forward flexion and excellent strength with abduction and and subscapularis.  External rotation strength is solid for where he is in his recovery and should continue to improve.  Patient is a Curator and works on gas pumps.  Plan to continue out of work for now.  Continue physical therapy according to the protocol provided to the therapist.  Refilled pain medicine for her Norco every 8 H as needed pain.  Plan to transition to tramadol at next visit.  Patient will follow up with the office in 4 weeks.  He may discontinue the brace and his incisions are healing well.  Follow-Up Instructions: No follow-ups on file.   Orders:  No orders of the defined types were placed in this encounter.  No orders of the defined types were placed in this encounter.   Imaging: No results found.  PMFS History: Patient Active Problem List   Diagnosis Date Noted  . Food allergy 11/10/2017  . Perennial allergic rhinitis 11/10/2017  . Tendonitis of upper biceps tendon of left shoulder 11/10/2017  . 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 Effusion of knee joint right 12/20/2013  . Primary osteoarthritis of right knee  12/20/2013  . Osteoarthritis of right knee 03/02/2013  . Knee pain, bilateral 03/02/2013  . LOWER LEG, ARTHRITIS, DEGEN./OSTEO 08/19/2007   Past Medical History:  Diagnosis Date  . Arthritis    bil knees, fingers bil  . Left rotator cuff tear     Family History  Family history unknown: Yes    Past Surgical History:  Procedure Laterality Date  . ALLOGRAFT APPLICATION Left 06/08/2019   Procedure: ALLOGRAFT APPLICATION left shoulder;  Surgeon: Cammy Copa, MD;  Location: [facility];  Service: Orthopedics;  Laterality: Left;  . KNEE ARTHROSCOPY    . SHOULDER ARTHROSCOPY WITH SUBACROMIAL DECOMPRESSION, ROTATOR CUFF REPAIR AND BICEP TENDON REPAIR Left 06/08/2019   Procedure: LEFT SHOULDER LOWER TRAPEZIUS TRANSFER WITH ALLOGRAFT, ARTHROSCOPY, BICEPS TENODESIS,  OPEN SUBSCAPULAR REPAIR;  Surgeon: Cammy Copa, MD;  Location: [facility];  Service: Orthopedics;  Laterality: Left;   Social History   Occupational History  . Not on file  Tobacco Use  . Smoking status: Never Smoker  . Smokeless tobacco: Never Used  Substance and Sexual Activity  . Alcohol use: Not Currently  . Drug use: No  . Sexual activity: Not on file

## 2019-08-09 ENCOUNTER — Telehealth: Payer: Self-pay | Admitting: Rehabilitative and Restorative Service Providers"

## 2019-08-09 ENCOUNTER — Encounter: Payer: BC Managed Care – PPO | Admitting: Rehabilitative and Restorative Service Providers"

## 2019-08-09 NOTE — Telephone Encounter (Signed)
Called and left message for patient regarding no show for today's appointment with instructions to call back.  Chyrel Masson, PT, DPT, OCS, ATC 08/09/19  9:00 AM

## 2019-08-11 ENCOUNTER — Other Ambulatory Visit: Payer: Self-pay

## 2019-08-11 ENCOUNTER — Encounter: Payer: Self-pay | Admitting: Rehabilitative and Restorative Service Providers"

## 2019-08-11 ENCOUNTER — Ambulatory Visit (INDEPENDENT_AMBULATORY_CARE_PROVIDER_SITE_OTHER): Payer: BC Managed Care – PPO | Admitting: Rehabilitative and Restorative Service Providers"

## 2019-08-11 DIAGNOSIS — M25512 Pain in left shoulder: Secondary | ICD-10-CM | POA: Diagnosis not present

## 2019-08-11 DIAGNOSIS — M6281 Muscle weakness (generalized): Secondary | ICD-10-CM | POA: Diagnosis not present

## 2019-08-11 DIAGNOSIS — G8929 Other chronic pain: Secondary | ICD-10-CM

## 2019-08-11 NOTE — Therapy (Signed)
[facility] Physical Therapy [address] [place], [place], [address] Phone: [phone]   Fax:  [phone]  Physical Therapy Treatment  Patient Details  Name: Henry Wolfe MRN: [id number] Date of Birth: April 09, 1961 Referring Provider (PT): Dr. Marlou Sa   Encounter Date: 08/11/2019  PT End of Session - 08/11/19 0914    Visit Number  6    Number of Visits  24    Date for PT Re-Evaluation  10/05/19    PT Start Time  0849    PT Stop Time  0929    PT Time Calculation (min)  40 min    Activity Tolerance  Patient tolerated treatment well    Behavior During Therapy  [facility] for tasks assessed/performed       Past Medical History:  Diagnosis Date  . Arthritis    bil knees, fingers bil  . Left rotator cuff tear     Past Surgical History:  Procedure Laterality Date  . ALLOGRAFT APPLICATION Left 07/10/348   Procedure: ALLOGRAFT APPLICATION left shoulder;  Surgeon: Meredith Pel, MD;  Location: [place];  Service: Orthopedics;  Laterality: Left;  . KNEE ARTHROSCOPY    . SHOULDER ARTHROSCOPY WITH SUBACROMIAL DECOMPRESSION, ROTATOR CUFF REPAIR AND BICEP TENDON REPAIR Left 06/08/2019   Procedure: LEFT SHOULDER LOWER TRAPEZIUS TRANSFER WITH ALLOGRAFT, ARTHROSCOPY, BICEPS TENODESIS,  OPEN SUBSCAPULAR REPAIR;  Surgeon: Meredith Pel, MD;  Location: [place];  Service: Orthopedics;  Laterality: Left;    There were no vitals filed for this visit.  Subjective Assessment - 08/11/19 0906    Subjective  Pt. reported occasional catch of pain c home movements but mostly painfree.    Currently in Pain?  No/denies    Pain Score  0-No pain    Pain Location  Shoulder    Pain Orientation  Left    Pain Onset  More than a month ago                       [facility] Adult PT Treatment/Exercise - 08/11/19 0001      Shoulder Exercises: Sidelying   External Rotation  AROM;Left   3 x 20   Flexion  AROM;Left   3 x 10   ABduction   AROM;Other (comment)   3 x 15     Shoulder Exercises: Standing   Extension  Strengthening;AROM;Both   3x 15   Theraband Level (Shoulder Extension)  Level 4 (Blue)    Extension Limitations  No movement past neutral    Row  Strengthening;AROM   3 x 15   Theraband Level (Shoulder Row)  Level 4 (Blue)      Shoulder Exercises: ROM/Strengthening   UBE (Upper Arm Bike)  L1.5 4 mins fwd, reverse each      Manual Therapy   Joint Mobilization  g2-g3 inferior, ap jt mobs [facility] joint             PT Education - 08/11/19 0906    Education Details  Review of movements to limit.    Methods  Explanation    Comprehension  Verbalized understanding       PT Short Term Goals - 08/04/19 0902      PT SHORT TERM GOAL #1   Title  Patient will demonstrate independent use of home exercise program to maintain progress from in clinic treatments.    Baseline  07/28/2019:  Showing knowledge of HEP c occasional cues required.    Time  2  Period  Weeks    Status  On-going    Target Date  08/11/19      PT SHORT TERM GOAL #2   Title  Pt will report at least 25% reduction in R UE radicular sxs to assist with work duties    Baseline  unmet    Time  3    Period  Weeks    Status  On-going      PT SHORT TERM GOAL #3   Title  Pt will report ability to lift L UE overhead with </= 3/10 L shoulder pain referral (along supraspinatus/lateral Deltoid referral) to perform iADLs    Baseline  6/10    Period  Weeks    Status  On-going        PT Long Term Goals - 07/13/19 2355      PT LONG TERM GOAL #1   Title  Patient will demonstrate/report pain at worst less than or equal to 2/10 to facilitate minimal limitation in daily activity secondary to pain symptoms.    Time  12    Period  Weeks    Status  New    Target Date  10/05/19      PT LONG TERM GOAL #2   Title  Patient will demonstrate independent use of home exercise program to facilitate ability to maintain/progress functional gains from skilled  physical therapy services.    Time  12    Period  Weeks    Status  New    Target Date  10/05/19      PT LONG TERM GOAL #3   Title  Patient will demonstrate return to work/recreational activity at previous level of function without limitations secondary due to condition    Time  12    Period  Weeks    Status  New    Target Date  10/05/19      PT LONG TERM GOAL #4   Title  Pt. will demonstrate L shoulder AROM WFL s symptoms to facilitate usual daily self care, reaching, functional activity at PLOF.    Time  12    Period  Weeks    Status  New    Target Date  10/05/19      PT LONG TERM GOAL #5   Title  Pt. will demonstrate LUE MMT 5/5 throughout to facilitate usual lifting, carrying for daily and work activity at [employer].    Time  12    Period  Weeks    Status  New    Target Date  10/05/19            Plan - 08/11/19 0912    Clinical Impression Statement  Pt. continues to show good overall progress in active movements.  Pt. showing good prep for upcoming strengthening based off protocol in approx. 2-3 weeks.    Personal Factors and Comorbidities  Profession    Examination-Activity Limitations  Bathing;Reach Overhead;Carry;Dressing;Hygiene/Grooming;Lift    Examination-Participation Restrictions  Development worker, community;Yard Work;Other    Stability/Clinical Decision Making  Stable/Uncomplicated    Rehab Potential  Good    PT Frequency  2x / week    PT Duration  12 weeks    PT Treatment/Interventions  ADLs/Self Care Home Management;Electrical Stimulation;Moist Heat;Iontophoresis 4mg /ml Dexamethasone;Functional mobility training;Therapeutic activities;Therapeutic exercise;Neuromuscular re-education;Manual techniques;Patient/family education;Passive range of motion;Dry needling;Joint Manipulations;Spinal Manipulations;Vasopneumatic Device;Taping    PT Next Visit Plan  AROM c gravity resistance progression.    PT Home Exercise Plan     Consulted and Agree with  Plan of Care  Patient       Patient will benefit from skilled therapeutic intervention in order to improve the following deficits and impairments:  Decreased range of motion, Impaired UE functional use, Pain, Decreased activity tolerance, Decreased strength, Decreased mobility  Visit Diagnosis: Chronic left shoulder pain  Muscle weakness (generalized)     Problem List Patient Active Problem List   Diagnosis Date Noted  . Food allergy 11/10/2017  . Perennial allergic rhinitis 11/10/2017  . Tendonitis of upper biceps tendon of left shoulder 11/10/2017  . 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 Effusion of knee joint right 12/20/2013  . Primary osteoarthritis of right knee 12/20/2013  . Osteoarthritis of right knee 03/02/2013  . Knee pain, bilateral 03/02/2013  . LOWER LEG, ARTHRITIS, DEGEN./OSTEO 08/19/2007   Chyrel Masson, PT, DPT, OCS, ATC 08/11/19  9:23 AM    [facility] Physical Therapy [address] [place], [place], [address] Phone: [phone]   Fax:  [phone]  Name: Henry Wolfe MRN: [id number] Date of Birth: 03-15-1961

## 2019-08-16 ENCOUNTER — Encounter: Payer: BC Managed Care – PPO | Admitting: Rehabilitative and Restorative Service Providers"

## 2019-08-18 ENCOUNTER — Encounter: Payer: Self-pay | Admitting: Rehabilitative and Restorative Service Providers"

## 2019-08-18 ENCOUNTER — Ambulatory Visit (INDEPENDENT_AMBULATORY_CARE_PROVIDER_SITE_OTHER): Payer: BC Managed Care – PPO | Admitting: Rehabilitative and Restorative Service Providers"

## 2019-08-18 ENCOUNTER — Other Ambulatory Visit: Payer: Self-pay

## 2019-08-18 DIAGNOSIS — M6281 Muscle weakness (generalized): Secondary | ICD-10-CM

## 2019-08-18 DIAGNOSIS — G8929 Other chronic pain: Secondary | ICD-10-CM

## 2019-08-18 DIAGNOSIS — M25512 Pain in left shoulder: Secondary | ICD-10-CM | POA: Diagnosis not present

## 2019-08-18 NOTE — Therapy (Signed)
[facility] [address] [place], [place], [address] Phone: [phone]   Fax:  [phone]  Physical Therapy Treatment  Patient Details  Name: Henry Wolfe MRN: [id number] Date of Birth: 1961/03/12 Referring Provider (PT): Dr. August Saucer   Encounter Date: 08/18/2019  PT End of Session - 08/18/19 0923    Visit Number  7    Number of Visits  24    Date for PT Re-Evaluation  10/05/19    PT Start Time  0847    PT Stop Time  0925    PT Time Calculation (min)  38 min    Activity Tolerance  Patient tolerated treatment well    Behavior During Therapy  [facility] for tasks assessed/performed       Past Medical History:  Diagnosis Date  . Arthritis    bil knees, fingers bil  . Left rotator cuff tear     Past Surgical History:  Procedure Laterality Date  . ALLOGRAFT APPLICATION Left 06/08/2019   Procedure: ALLOGRAFT APPLICATION left shoulder;  Surgeon: Cammy Copa, MD;  Location: [facility];  Service: Orthopedics;  Laterality: Left;  . KNEE ARTHROSCOPY    . SHOULDER ARTHROSCOPY WITH SUBACROMIAL DECOMPRESSION, ROTATOR CUFF REPAIR AND BICEP TENDON REPAIR Left 06/08/2019   Procedure: LEFT SHOULDER LOWER TRAPEZIUS TRANSFER WITH ALLOGRAFT, ARTHROSCOPY, BICEPS TENODESIS,  OPEN SUBSCAPULAR REPAIR;  Surgeon: Cammy Copa, MD;  Location: [facility];  Service: Orthopedics;  Laterality: Left;    There were no vitals filed for this visit.  Subjective Assessment - 08/18/19 0858    Subjective  Pt. indicated some soreness at times but overall feeling pretty good and minimal pain.    Currently in Pain?  No/denies    Pain Score  0-No pain    Pain Location  Shoulder    Pain Orientation  Left    Pain Onset  More than a month ago                       [facility] Adult PT Treatment/Exercise - 08/18/19 0001      Shoulder Exercises: Supine   Other Supine Exercises  supine wand flexion 2 lb bar 2 x 10      Shoulder  Exercises: Sidelying   External Rotation  AROM;Left   3 x 20   Flexion  AROM;Left   3 x 20   ABduction  AROM;Other (comment)   3 x 20     Shoulder Exercises: Standing   Flexion  AROM;Both   3 x 10   Extension  Strengthening;AROM;Both;20 reps    Theraband Level (Shoulder Extension)  Level 4 (Blue)    Other Standing Exercises  scaption 3 x 10 bilateral      Shoulder Exercises: ROM/Strengthening   UBE (Upper Arm Bike)  Lvl 1.8 5 mins fwd/rev each      Manual Therapy   Joint Mobilization  g2-g3 inferior, ap jt mobs Lt [facility] joint             PT Education - 08/18/19 0859    Education Details  Technique cues at times    Person(s) Educated  Patient    Methods  Explanation    Comprehension  Verbalized understanding       PT Short Term Goals - 08/04/19 0902      PT SHORT TERM GOAL #1   Title  Patient will demonstrate independent use of home exercise program to maintain progress from in clinic treatments.  Baseline  07/28/2019:  Showing knowledge of HEP c occasional cues required.    Time  2    Period  Weeks    Status  On-going    Target Date  08/11/19      PT SHORT TERM GOAL #2   Title  Pt will report at least 25% reduction in R UE radicular sxs to assist with work duties    Baseline  unmet    Time  3    Period  Weeks    Status  On-going      PT SHORT TERM GOAL #3   Title  Pt will report ability to lift L UE overhead with </= 3/10 L shoulder pain referral (along supraspinatus/lateral Deltoid referral) to perform iADLs    Baseline  6/10    Period  Weeks    Status  On-going        PT Long Term Goals - 07/13/19 4098      PT LONG TERM GOAL #1   Title  Patient will demonstrate/report pain at worst less than or equal to 2/10 to facilitate minimal limitation in daily activity secondary to pain symptoms.    Time  12    Period  Weeks    Status  New    Target Date  10/05/19      PT LONG TERM GOAL #2   Title  Patient will demonstrate independent use of home exercise  program to facilitate ability to maintain/progress functional gains from skilled physical therapy services.    Time  12    Period  Weeks    Status  New    Target Date  10/05/19      PT LONG TERM GOAL #3   Title  Patient will demonstrate return to work/recreational activity at previous level of function without limitations secondary due to condition    Time  12    Period  Weeks    Status  New    Target Date  10/05/19      PT LONG TERM GOAL #4   Title  Pt. will demonstrate L shoulder AROM WFL s symptoms to facilitate usual daily self care, reaching, functional activity at PLOF.    Time  12    Period  Weeks    Status  New    Target Date  10/05/19      PT LONG TERM GOAL #5   Title  Pt. will demonstrate LUE MMT 5/5 throughout to facilitate usual lifting, carrying for daily and work activity at [employer].    Time  12    Period  Weeks    Status  New    Target Date  10/05/19            Plan - 08/18/19 1191    Clinical Impression Statement  Gravity resisted AROM performed today c good control, mild fatigue reported.  No evidence of shrug sign noted.    Personal Factors and Comorbidities  Profession    Examination-Activity Limitations  Bathing;Reach Overhead;Carry;Dressing;Hygiene/Grooming;Lift    Examination-Participation Restrictions  Investment banker, operational;Yard Work;Other    Stability/Clinical Decision Making  Stable/Uncomplicated    Rehab Potential  Good    PT Frequency  2x / week    PT Duration  12 weeks    PT Treatment/Interventions  ADLs/Self Care Home Management;Electrical Stimulation;Moist Heat;Iontophoresis 4mg /ml Dexamethasone;Functional mobility training;Therapeutic activities;Therapeutic exercise;Neuromuscular re-education;Manual techniques;Patient/family education;Passive range of motion;Dry needling;Joint Manipulations;Spinal Manipulations;Vasopneumatic Device;Taping    PT Next Visit Plan  Gravity dependent AROM, scapular control  PT Home Exercise Plan   [id number]    Consulted and Agree with Plan of Care  Patient       Patient will benefit from skilled therapeutic intervention in order to improve the following deficits and impairments:  Decreased range of motion, Impaired UE functional use, Pain, Decreased activity tolerance, Decreased strength, Decreased mobility  Visit Diagnosis: Chronic left shoulder pain  Muscle weakness (generalized)     Problem List Patient Active Problem List   Diagnosis Date Noted  . Food allergy 11/10/2017  . Perennial allergic rhinitis 11/10/2017  . Tendonitis of upper biceps tendon of left shoulder 11/10/2017  . 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 Effusion of knee joint right 12/20/2013  . Primary osteoarthritis of right knee 12/20/2013  . Osteoarthritis of right knee 03/02/2013  . Knee pain, bilateral 03/02/2013  . LOWER LEG, ARTHRITIS, DEGEN./OSTEO 08/19/2007   Chyrel Masson, PT, DPT, OCS, ATC 08/18/19  9:24 AM    [facility] [address] [place], [place], [address] Phone: [phone]   Fax:  [phone]  Name: ABEDNEGO YEATES MRN: [id number] Date of Birth: June 13, 1960

## 2019-08-19 ENCOUNTER — Other Ambulatory Visit: Payer: Self-pay | Admitting: Surgical

## 2019-08-19 ENCOUNTER — Telehealth: Payer: Self-pay | Admitting: Orthopedic Surgery

## 2019-08-19 MED ORDER — HYDROCODONE-ACETAMINOPHEN 5-325 MG PO TABS: 1.0000 | ORAL_TABLET | Freq: Every day | ORAL | 0 refills | Status: DC | PRN

## 2019-08-19 NOTE — Telephone Encounter (Signed)
Pls advise. Thanks.  

## 2019-08-19 NOTE — Telephone Encounter (Signed)
Patient called.   He is requesting a refill on his Hydrocodone.   Call back: [phone]

## 2019-08-19 NOTE — Telephone Encounter (Signed)
Called patient, advised, verbalized understanding

## 2019-08-23 ENCOUNTER — Telehealth: Payer: Self-pay | Admitting: Rehabilitative and Restorative Service Providers"

## 2019-08-23 ENCOUNTER — Encounter: Payer: BC Managed Care – PPO | Admitting: Rehabilitative and Restorative Service Providers"

## 2019-08-23 NOTE — Telephone Encounter (Signed)
Patient reported he was called into court and didn't remember to call to cancel the appointment today.  Patient has one more visit scheduled for Wednesday of this week.  Advised him to call and schedule more when he has time to look at his calendar.

## 2019-08-25 ENCOUNTER — Encounter: Payer: BC Managed Care – PPO | Admitting: Rehabilitative and Restorative Service Providers"

## 2019-08-25 ENCOUNTER — Telehealth: Payer: Self-pay | Admitting: Rehabilitative and Restorative Service Providers"

## 2019-08-25 NOTE — Telephone Encounter (Signed)
I called and left voice message for patient regarding no show for appointment this morning. Next visit is with MD.   Chyrel Masson, PT, DPT, OCS, ATC 08/25/19  9:04 AM

## 2019-09-01 ENCOUNTER — Other Ambulatory Visit: Payer: Self-pay

## 2019-09-01 ENCOUNTER — Telehealth: Payer: Self-pay

## 2019-09-01 ENCOUNTER — Other Ambulatory Visit: Payer: Self-pay | Admitting: Surgical

## 2019-09-01 ENCOUNTER — Encounter: Payer: Self-pay | Admitting: Orthopedic Surgery

## 2019-09-01 ENCOUNTER — Ambulatory Visit (INDEPENDENT_AMBULATORY_CARE_PROVIDER_SITE_OTHER): Payer: BC Managed Care – PPO | Admitting: Orthopedic Surgery

## 2019-09-01 DIAGNOSIS — M75122 Complete rotator cuff tear or rupture of left shoulder, not specified as traumatic: Secondary | ICD-10-CM

## 2019-09-01 MED ORDER — TRAMADOL HCL 50 MG PO TABS: 50.0000 mg | ORAL_TABLET | Freq: Three times a day (TID) | ORAL | 0 refills | Status: DC | PRN

## 2019-09-01 MED ORDER — METHOCARBAMOL 500 MG PO TABS: 500.0000 mg | ORAL_TABLET | Freq: Three times a day (TID) | ORAL | 0 refills | Status: DC | PRN

## 2019-09-01 NOTE — Progress Notes (Signed)
   Post-Op Visit Note   Patient: Henry Wolfe           Date of Birth: 18-Jul-1960           MRN: [id number] Visit Date: 09/01/2019 PCP: Avon Gully, MD   Assessment & Plan:  Chief Complaint:  Chief Complaint  Patient presents with  . Left Shoulder - Follow-up   Visit Diagnoses:  1. Complete tear of left rotator cuff, unspecified whether traumatic     Plan: Patient presents now about 3 months out left lower trapezius transfer.  On exam he is got a little crepitus with passive range of motion but improving strength.  He is able to forward flex and AB duct well above 90 degrees.  I will try him on Ultram and refill his Robaxin.  6-week return.  Continue therapy 1 time a week for strengthening and we will see him back in 6 weeks.  Follow-Up Instructions: Return in about 6 weeks (around 10/13/2019).   Orders:  Orders Placed This Encounter  Procedures  . Ambulatory referral to Physical Therapy   No orders of the defined types were placed in this encounter.   Imaging: No results found.  PMFS History: Patient Active Problem List   Diagnosis Date Noted  . Food allergy 11/10/2017  . Perennial allergic rhinitis 11/10/2017  . Tendonitis of upper biceps tendon of left shoulder 11/10/2017  . 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 Effusion of knee joint right 12/20/2013  . Primary osteoarthritis of right knee 12/20/2013  . Osteoarthritis of right knee 03/02/2013  . Knee pain, bilateral 03/02/2013  . LOWER LEG, ARTHRITIS, DEGEN./OSTEO 08/19/2007   Past Medical History:  Diagnosis Date  . Arthritis    bil knees, fingers bil  . Left rotator cuff tear     Family History  Family history unknown: Yes    Past Surgical History:  Procedure Laterality Date  . ALLOGRAFT APPLICATION Left 06/08/2019   Procedure: ALLOGRAFT APPLICATION left shoulder;  Surgeon: Cammy Copa, MD;  Location: [facility];  Service: Orthopedics;  Laterality: Left;  . KNEE ARTHROSCOPY    .  SHOULDER ARTHROSCOPY WITH SUBACROMIAL DECOMPRESSION, ROTATOR CUFF REPAIR AND BICEP TENDON REPAIR Left 06/08/2019   Procedure: LEFT SHOULDER LOWER TRAPEZIUS TRANSFER WITH ALLOGRAFT, ARTHROSCOPY, BICEPS TENODESIS,  OPEN SUBSCAPULAR REPAIR;  Surgeon: Cammy Copa, MD;  Location: [facility];  Service: Orthopedics;  Laterality: Left;   Social History   Occupational History  . Not on file  Tobacco Use  . Smoking status: Never Smoker  . Smokeless tobacco: Never Used  Substance and Sexual Activity  . Alcohol use: Not Currently  . Drug use: No  . Sexual activity: Not on file

## 2019-09-01 NOTE — Telephone Encounter (Signed)
submitted

## 2019-09-01 NOTE — Telephone Encounter (Signed)
Patient seen by Dr August Saucer today. Needs Ultram 1 po q 8 hrs prn #40 And robaxin 500mg  1 po q 12hrs #30

## 2019-09-01 NOTE — Telephone Encounter (Signed)
noted 

## 2019-09-02 NOTE — Telephone Encounter (Signed)
Patient notified

## 2019-09-15 ENCOUNTER — Encounter: Payer: BC Managed Care – PPO | Admitting: Rehabilitative and Restorative Service Providers"

## 2019-09-16 ENCOUNTER — Ambulatory Visit (INDEPENDENT_AMBULATORY_CARE_PROVIDER_SITE_OTHER): Payer: BC Managed Care – PPO | Admitting: Rehabilitative and Restorative Service Providers"

## 2019-09-16 ENCOUNTER — Other Ambulatory Visit: Payer: Self-pay

## 2019-09-16 ENCOUNTER — Encounter: Payer: Self-pay | Admitting: Rehabilitative and Restorative Service Providers"

## 2019-09-16 DIAGNOSIS — M6281 Muscle weakness (generalized): Secondary | ICD-10-CM

## 2019-09-16 DIAGNOSIS — M25512 Pain in left shoulder: Secondary | ICD-10-CM | POA: Diagnosis not present

## 2019-09-16 DIAGNOSIS — G8929 Other chronic pain: Secondary | ICD-10-CM

## 2019-09-16 NOTE — Therapy (Signed)
[facility] [address] [place], [place], [address] Phone: [phone]   Fax:  [phone]  Physical Therapy Treatment/Recert   Patient Details  Name: Henry Wolfe MRN: [id number] Date of Birth: 26-Sep-1960 Referring Provider (PT): Dr. Marlou Sa   Encounter Date: 09/16/2019  PT End of Session - 09/16/19 1113    Visit Number  8    Number of Visits  24    Date for PT Re-Evaluation  10/28/19    PT Start Time  1020    PT Stop Time  1102    PT Time Calculation (min)  42 min    Activity Tolerance  Patient tolerated treatment well    Behavior During Therapy  [facility] for tasks assessed/performed       Past Medical History:  Diagnosis Date  . Arthritis    bil knees, fingers bil  . Left rotator cuff tear     Past Surgical History:  Procedure Laterality Date  . ALLOGRAFT APPLICATION Left 07/10/621   Procedure: ALLOGRAFT APPLICATION left shoulder;  Surgeon: Meredith Pel, MD;  Location: [facility];  Service: Orthopedics;  Laterality: Left;  . KNEE ARTHROSCOPY    . SHOULDER ARTHROSCOPY WITH SUBACROMIAL DECOMPRESSION, ROTATOR CUFF REPAIR AND BICEP TENDON REPAIR Left 06/08/2019   Procedure: LEFT SHOULDER LOWER TRAPEZIUS TRANSFER WITH ALLOGRAFT, ARTHROSCOPY, BICEPS TENODESIS,  OPEN SUBSCAPULAR REPAIR;  Surgeon: Meredith Pel, MD;  Location: [place];  Service: Orthopedics;  Laterality: Left;    There were no vitals filed for this visit.  Subjective Assessment - 09/16/19 1020    Subjective  Patient reports some soreness today. Reports compliance with HEP. Patient reports he stopped taking pain medication within the last week.    Currently in Pain?  Yes    Pain Score  3     Pain Location  Shoulder    Pain Orientation  Left    Pain Descriptors / Indicators  Aching    Pain Type  Surgical pain    Pain Onset  More than a month ago                       [facility] Adult PT Treatment/Exercise - 09/16/19  1024      Shoulder Exercises: Supine   Other Supine Exercises  supine wand flexion 2 lb bar 2 x 12      Shoulder Exercises: Seated   Row  Strengthening;Both;10 reps   2 sets    Theraband Level (Shoulder Row)  Level 3 (Green)    Other Seated Exercises  seated 1 lb wand exercise shoulder flexion  12, 2 sets   cueing required to access FULL available ROM     Shoulder Exercises: Sidelying   External Rotation  --    Flexion  --    ABduction  --      Shoulder Exercises: Standing   Flexion  AROM;Both;12 reps   12 reps, 3 sets; wall slide    Extension  --    Theraband Level (Shoulder Extension)  --    Other Standing Exercises  standing L shoulder IR + yellow theraband x 10, 3 sets       Shoulder Exercises: ROM/Strengthening   UBE (Upper Arm Bike)  Lvl 1.5 3 mins fwd/rev each      Manual Therapy   Joint Mobilization  --               PT Short Term Goals - 09/16/19 1050  PT SHORT TERM GOAL #1   Title  Patient will demonstrate independent use of home exercise program to maintain progress from in clinic treatments.    Baseline  09/16/19: patient reports daily compliance with HEP and full understanding of program (delayed assessment of STGs due to multiple no shows)    Time  2    Period  Weeks    Status  Achieved    Target Date  08/11/19      PT SHORT TERM GOAL #2   Title  Pt will report at least 25% reduction in R UE radicular sxs to assist with work duties    Baseline  09/16/19: patient denies any upper extremity tingling in either upper extremity when performing work related duties he is currently working on (he is working a modified work schedule)    Time  3    Period  Weeks    Status  On-going      PT [place] #3   Title  Pt will report ability to lift L UE overhead with </= 3/10 L shoulder pain referral (along supraspinatus/lateral Deltoid referral) to perform iADLs    Period  Weeks    Status  Achieved        PT Long Term Goals - 09/16/19 1106       PT LONG TERM GOAL #1   Title  Patient will demonstrate/report pain at worst less than or equal to 2/10 to facilitate minimal limitation in daily activity secondary to pain symptoms.    Time  12    Period  Weeks    Status  On-going    Target Date  10/28/19      PT LONG TERM GOAL #2   Title  Patient will demonstrate independent use of home exercise program to facilitate ability to maintain/progress functional gains from skilled physical therapy services.    Time  12    Period  Weeks    Status  On-going    Target Date  10/28/19      PT LONG TERM GOAL #3   Title  Patient will demonstrate return to work/recreational activity at previous level of function without limitations secondary due to condition    Time  12    Period  Weeks    Status  On-going    Target Date  10/28/19      PT LONG TERM GOAL #4   Title  Pt. will demonstrate L shoulder AROM WFL s symptoms to facilitate usual daily self care, reaching, functional activity at PLOF.    Time  12    Period  Weeks    Status  On-going    Target Date  10/28/19      PT LONG TERM GOAL #5   Title  Pt. will demonstrate LUE MMT 5/5 throughout to facilitate usual lifting, carrying for daily and work activity at [employer].    Time  12    Period  Weeks    Status  On-going    Target Date  10/28/19            Plan - 09/16/19 1107    Clinical Impression Statement  Today's session focused o STG assessment, which was much delayed due to multiple no shows and appoitnment changes. Patient has not been seen for approximately 1 month. He has met all of his short term goals. Follow up wiht referring provider went well and all post-op restrictions removed. He tolerated well a progression of LUE AROM and strengthening including anti-gravity  positions reporting fatigue only (no pain). In light of great schedule changes, PT will submit recert today for additional 6 weeks wiht frequency of 1x/wk.    Personal Factors and Comorbidities  Profession     Examination-Activity Limitations  Bathing;Reach Overhead;Carry;Dressing;Hygiene/Grooming;Lift    Examination-Participation Restrictions  Investment banker, operational;Yard Work;Other    Stability/Clinical Decision Making  Stable/Uncomplicated    Rehab Potential  Good    PT Frequency  1x / week    PT Duration  6 weeks    PT Treatment/Interventions  ADLs/Self Care Home Management;Electrical Stimulation;Moist Heat;Iontophoresis 26m/ml Dexamethasone;Functional mobility training;Therapeutic activities;Therapeutic exercise;Neuromuscular re-education;Manual techniques;Patient/family education;Passive range of motion;Dry needling;Joint Manipulations;Spinal Manipulations;Vasopneumatic Device;Taping    PT Next Visit Plan  scapular control, update HEP into anti-gravity positions and increased resistance    PT Home Exercise Plan  [id number]   Consulted and Agree with Plan of Care  Patient       Patient will benefit from skilled therapeutic intervention in order to improve the following deficits and impairments:  Decreased range of motion, Impaired UE functional use, Pain, Decreased activity tolerance, Decreased strength, Decreased mobility  Visit Diagnosis: Muscle weakness (generalized)  Chronic left shoulder pain     Problem List Patient Active Problem List   Diagnosis Date Noted  . Food allergy 11/10/2017  . Perennial allergic rhinitis 11/10/2017  . Tendonitis of upper biceps tendon of left shoulder 11/10/2017  . 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 Effusion of knee joint right 12/20/2013  . Primary osteoarthritis of right knee 12/20/2013  . Osteoarthritis of right knee 03/02/2013  . Knee pain, bilateral 03/02/2013  . LOWER LEG, ARTHRITIS, DEGEN./OSTEO 08/19/2007    [place]Smith[place] Ferry4/15/2021, 11:52 AM  [facility] [address] [address]Phone: [phone]  Fax:  [phone] Name: EMAXIE SLOVACEKMRN: [id number] of  Birth: [birth year]

## 2019-09-22 ENCOUNTER — Ambulatory Visit (INDEPENDENT_AMBULATORY_CARE_PROVIDER_SITE_OTHER): Payer: BC Managed Care – PPO | Admitting: Physical Therapy

## 2019-09-22 ENCOUNTER — Other Ambulatory Visit: Payer: Self-pay

## 2019-09-22 ENCOUNTER — Encounter: Payer: Self-pay | Admitting: Physical Therapy

## 2019-09-22 DIAGNOSIS — M25512 Pain in left shoulder: Secondary | ICD-10-CM

## 2019-09-22 DIAGNOSIS — M6281 Muscle weakness (generalized): Secondary | ICD-10-CM | POA: Diagnosis not present

## 2019-09-22 DIAGNOSIS — G8929 Other chronic pain: Secondary | ICD-10-CM | POA: Diagnosis not present

## 2019-09-22 NOTE — Therapy (Signed)
[facility] Physical Therapy [address] [place], [place], [address] Phone: [phone]   Fax:  [phone]  Physical Therapy Treatment  Patient Details  Name: Henry Wolfe MRN: [id number] Date of Birth: 1961-03-28 Referring Provider (PT): Dr. August Saucer   Encounter Date: 09/22/2019  PT End of Session - 09/22/19 1030    Visit Number  9    Number of Visits  24    Date for PT Re-Evaluation  10/28/19    PT Start Time  1015    PT Stop Time  1100    PT Time Calculation (min)  45 min    Activity Tolerance  Patient tolerated treatment well    Behavior During Therapy  [facility] for tasks assessed/performed       Past Medical History:  Diagnosis Date  . Arthritis    bil knees, fingers bil  . Left rotator cuff tear     Past Surgical History:  Procedure Laterality Date  . ALLOGRAFT APPLICATION Left 06/08/2019   Procedure: ALLOGRAFT APPLICATION left shoulder;  Surgeon: Cammy Copa, MD;  Location: [facility];  Service: Orthopedics;  Laterality: Left;  . KNEE ARTHROSCOPY    . SHOULDER ARTHROSCOPY WITH SUBACROMIAL DECOMPRESSION, ROTATOR CUFF REPAIR AND BICEP TENDON REPAIR Left 06/08/2019   Procedure: LEFT SHOULDER LOWER TRAPEZIUS TRANSFER WITH ALLOGRAFT, ARTHROSCOPY, BICEPS TENODESIS,  OPEN SUBSCAPULAR REPAIR;  Surgeon: Cammy Copa, MD;  Location: [facility];  Service: Orthopedics;  Laterality: Left;    There were no vitals filed for this visit.  Subjective Assessment - 09/22/19 1027    Subjective  Pt arriving to therpay reporting 5/10 pain in L shoulder. Pt reporting he hasn't been taking any prescription pain meds other than over the counter Aleve.    Currently in Pain?  Yes    Pain Score  5     Pain Location  Shoulder    Pain Orientation  Left    Pain Descriptors / Indicators  Aching    Pain Type  Surgical pain    Pain Onset  More than a month ago                       [facility] Adult PT Treatment/Exercise -  09/22/19 0001      Shoulder Exercises: Supine   Flexion  AROM;Both;Weights    Flexion Limitations  wand 2#    Other Supine Exercises  supine wand flexion 2 lb bar 2 x 12      Shoulder Exercises: Seated   Row  Strengthening;Both;10 reps   2 sets    Theraband Level (Shoulder Row)  Level 3 (Green)      Shoulder Exercises: Sidelying   External Rotation  AROM;Left;15 reps   2 sets   Flexion  AROM;Left;15 reps   2 sets   ABduction  AROM;Left;15 reps   2 sets     Shoulder Exercises: Standing   Flexion  AROM;Both;12 reps   12 reps, 3 sets; wall slide    Other Standing Exercises  standing L shoulder IR + Level 2  theraband x 10, 3 sets       Shoulder Exercises: ROM/Strengthening   UBE (Upper Arm Bike)  Lvl 1.5 3 mins fwd/rev each               PT Short Term Goals - 09/16/19 1050      PT SHORT TERM GOAL #1   Title  Patient will demonstrate independent use of home exercise program  to maintain progress from in clinic treatments.    Baseline  09/16/19: patient reports daily compliance with HEP and full understanding of program (delayed assessment of STGs due to multiple no shows)    Time  2    Period  Weeks    Status  Achieved    Target Date  08/11/19      PT SHORT TERM GOAL #2   Title  Pt will report at least 25% reduction in R UE radicular sxs to assist with work duties    Baseline  09/16/19: patient denies any upper extremity tingling in either upper extremity when performing work related duties he is currently working on (he is working a modified work schedule)    Time  3    Period  Weeks    Status  On-going      PT SHORT TERM GOAL #3   Title  Pt will report ability to lift L UE overhead with </= 3/10 L shoulder pain referral (along supraspinatus/lateral Deltoid referral) to perform iADLs    Period  Weeks    Status  Achieved        PT Long Term Goals - 09/16/19 1106      PT LONG TERM GOAL #1   Title  Patient will demonstrate/report pain at worst less than or  equal to 2/10 to facilitate minimal limitation in daily activity secondary to pain symptoms.    Time  12    Period  Weeks    Status  On-going    Target Date  10/28/19      PT LONG TERM GOAL #2   Title  Patient will demonstrate independent use of home exercise program to facilitate ability to maintain/progress functional gains from skilled physical therapy services.    Time  12    Period  Weeks    Status  On-going    Target Date  10/28/19      PT LONG TERM GOAL #3   Title  Patient will demonstrate return to work/recreational activity at previous level of function without limitations secondary due to condition    Time  12    Period  Weeks    Status  On-going    Target Date  10/28/19      PT LONG TERM GOAL #4   Title  Pt. will demonstrate L shoulder AROM WFL s symptoms to facilitate usual daily self care, reaching, functional activity at PLOF.    Time  12    Period  Weeks    Status  On-going    Target Date  10/28/19      PT LONG TERM GOAL #5   Title  Pt. will demonstrate LUE MMT 5/5 throughout to facilitate usual lifting, carrying for daily and work activity at [employer].    Time  12    Period  Weeks    Status  On-going    Target Date  10/28/19            Plan - 09/22/19 1033    Clinical Impression Statement  Pt tolerating exericses well. Pt still limited with ER  and IR. Progressing toward  more strengthening and funcitonal mobility. Pt reporting 3/10 pain at end of session. Pt reporting feeling much better. Continue skilled PT to progress toward LTG's.    Personal Factors and Comorbidities  Profession    Examination-Activity Limitations  Bathing;Reach Overhead;Carry;Dressing;Hygiene/Grooming;Lift    Examination-Participation Restrictions  Development worker, community;Yard Work;Other    PT Frequency  1x / week  PT Duration  6 weeks    PT Treatment/Interventions  ADLs/Self Care Home Management;Electrical Stimulation;Moist Heat;Iontophoresis 4mg /ml  Dexamethasone;Functional mobility training;Therapeutic activities;Therapeutic exercise;Neuromuscular re-education;Manual techniques;Patient/family education;Passive range of motion;Dry needling;Joint Manipulations;Spinal Manipulations;Vasopneumatic Device;Taping    PT Next Visit Plan  scapular control, update HEP into anti-gravity positions and increased resistance    Consulted and Agree with Plan of Care  Patient       Patient will benefit from skilled therapeutic intervention in order to improve the following deficits and impairments:  Decreased range of motion, Impaired UE functional use, Pain, Decreased activity tolerance, Decreased strength, Decreased mobility  Visit Diagnosis: Muscle weakness (generalized)  Chronic left shoulder pain     Problem List Patient Active Problem List   Diagnosis Date Noted  . Food allergy 11/10/2017  . Perennial allergic rhinitis 11/10/2017  . Tendonitis of upper biceps tendon of left shoulder 11/10/2017  . 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 Effusion of knee joint right 12/20/2013  . Primary osteoarthritis of right knee 12/20/2013  . Osteoarthritis of right knee 03/02/2013  . Knee pain, bilateral 03/02/2013  . LOWER LEG, ARTHRITIS, DEGEN./OSTEO 08/19/2007    Oretha Caprice, MPT 09/22/2019, 10:59 AM  [facility] Physical Therapy [address] [place], [place], [address] Phone: [phone]   Fax:  [phone]  Name: CAYDAN MCTAVISH MRN: [id number] Date of Birth: 02/13/1961

## 2019-09-29 ENCOUNTER — Encounter: Payer: Self-pay | Admitting: Rehabilitative and Restorative Service Providers"

## 2019-09-29 ENCOUNTER — Other Ambulatory Visit: Payer: Self-pay

## 2019-09-29 ENCOUNTER — Ambulatory Visit (INDEPENDENT_AMBULATORY_CARE_PROVIDER_SITE_OTHER): Payer: BC Managed Care – PPO | Admitting: Rehabilitative and Restorative Service Providers"

## 2019-09-29 DIAGNOSIS — M6281 Muscle weakness (generalized): Secondary | ICD-10-CM

## 2019-09-29 DIAGNOSIS — M25512 Pain in left shoulder: Secondary | ICD-10-CM

## 2019-09-29 DIAGNOSIS — G8929 Other chronic pain: Secondary | ICD-10-CM | POA: Diagnosis not present

## 2019-09-29 NOTE — Patient Instructions (Addendum)
[id number]  HEP Prone 90 & 100 degrees with full ER; theraband ER and thumb up the back stretch

## 2019-09-29 NOTE — Therapy (Signed)
[facility] Physical Therapy [address] [place], [place], [address] Phone: [phone]   Fax:  [phone]  Physical Therapy Treatment  Patient Details  Name: Henry Wolfe MRN: [id number] Date of Birth: [birth year] Referring Provider (PT): Dr. August Saucer   Encounter Date: 09/29/2019  PT End of Session - 09/29/19 1527    Visit Number  10    Number of Visits  24    Date for PT Re-Evaluation  10/28/19    PT Start Time  1104    PT Stop Time  1146    PT Time Calculation (min)  42 min    Activity Tolerance  Patient tolerated treatment well    Behavior During Therapy  [facility] for tasks assessed/performed       Past Medical History:  Diagnosis Date  . Arthritis    bil knees, fingers bil  . Left rotator cuff tear     Past Surgical History:  Procedure Laterality Date  . ALLOGRAFT APPLICATION Left 06/08/2019   Procedure: ALLOGRAFT APPLICATION left shoulder;  Surgeon: Cammy Copa, MD;  Location: [facility];  Service: Orthopedics;  Laterality: Left;  . KNEE ARTHROSCOPY    . SHOULDER ARTHROSCOPY WITH SUBACROMIAL DECOMPRESSION, ROTATOR CUFF REPAIR AND BICEP TENDON REPAIR Left 06/08/2019   Procedure: LEFT SHOULDER LOWER TRAPEZIUS TRANSFER WITH ALLOGRAFT, ARTHROSCOPY, BICEPS TENODESIS,  OPEN SUBSCAPULAR REPAIR;  Surgeon: Cammy Copa, MD;  Location:  [facility]ERY [facility];  Service: Orthopedics;  Laterality: Left;    There were no vitals filed for this visit.  Subjective Assessment - 09/29/19 1517    Subjective  Henry Wolfe reports improving L shoulder function.  Behind the back reaching, turning a steering wheel, reaching and lifting remain somewhat limited.    Currently in Pain?  Yes    Pain Score  4     Pain Onset  More than a month ago    Effect of Pain on Daily Activities  See above.                       [facility] Adult PT Treatment/Exercise - 09/29/19 0001      Therapeutic Activites    Therapeutic Activities  Lifting   TRX  pull-ups; scapular retraction     Exercises   Exercises  Shoulder      Shoulder Exercises: Supine   Protraction  --   TRX for functional reaching (2 sets)   Theraband Level (Shoulder External Rotation)  --   3 sets of 10 slow eccentrics with towel under elbow     Shoulder Exercises: Prone   Retraction  Strengthening   Prone 90 & 100 degrees with full ER (Thumb up) 2 sets of 10      Shoulder Exercises: Standing   Protraction  Strengthening;20 reps   TRX for functional reaching   Internal Rotation  AROM;10 reps   10 seconds thumb up the back stretch   Row  Strengthening;20 reps   TRX for functional pulling     Shoulder Exercises: ROM/Strengthening   UBE (Upper Arm Bike)  LVL 4.8  8 minutes  (:30 forward & back)             PT Education - 09/29/19 0001    Education Details  --   Demonstrated/verbal and tactile cues/return demonstration   Person(s) Educated  Patient    Methods  Demonstration;Explanation;Tactile cues;Verbal cues;Handout    Comprehension  Verbalized understanding;Returned demonstration;Verbal cues required;Tactile cues required;Need further instruction  PT Short Term Goals - 09/16/19 1050      PT SHORT TERM GOAL #1   Title  Patient will demonstrate independent use of home exercise program to maintain progress from in clinic treatments.    Baseline  09/16/19: patient reports daily compliance with HEP and full understanding of program (delayed assessment of STGs due to multiple no shows)    Time  2    Period  Weeks    Status  Achieved    Target Date  08/11/19      PT SHORT TERM GOAL #2   Title  Pt will report at least 25% reduction in R UE radicular sxs to assist with work duties    Baseline  09/16/19: patient denies any upper extremity tingling in either upper extremity when performing work related duties he is currently working on (he is working a modified work schedule)    Time  3    Period  Weeks    Status  On-going      PT SHORT TERM GOAL  #3   Title  Pt will report ability to lift L UE overhead with </= 3/10 L shoulder pain referral (along supraspinatus/lateral Deltoid referral) to perform iADLs    Period  Weeks    Status  Achieved        PT Long Term Goals - 09/16/19 1106      PT LONG TERM GOAL #1   Title  Patient will demonstrate/report pain at worst less than or equal to 2/10 to facilitate minimal limitation in daily activity secondary to pain symptoms.    Time  12    Period  Weeks    Status  On-going    Target Date  10/28/19      PT LONG TERM GOAL #2   Title  Patient will demonstrate independent use of home exercise program to facilitate ability to maintain/progress functional gains from skilled physical therapy services.    Time  12    Period  Weeks    Status  On-going    Target Date  10/28/19      PT LONG TERM GOAL #3   Title  Patient will demonstrate return to work/recreational activity at previous level of function without limitations secondary due to condition    Time  12    Period  Weeks    Status  On-going    Target Date  10/28/19      PT LONG TERM GOAL #4   Title  Pt. will demonstrate L shoulder AROM WFL s symptoms to facilitate usual daily self care, reaching, functional activity at PLOF.    Time  12    Period  Weeks    Status  On-going    Target Date  10/28/19      PT LONG TERM GOAL #5   Title  Pt. will demonstrate LUE MMT 5/5 throughout to facilitate usual lifting, carrying for daily and work activity at [employer].    Time  12    Period  Weeks    Status  On-going    Target Date  10/28/19            Plan - 09/29/19 1528    Clinical Impression Statement  Henry Wolfe will benefit from continued IR stretching to reach behind his back.  Additional functional work for pushing/pulling and strength work will allow him to fully participate in activities required to baby sit his 19 grandchildren.    Personal Factors and Comorbidities  Profession  Examination-Activity Limitations  Bathing;Reach  Overhead;Carry;Dressing;Hygiene/Grooming;Lift    Examination-Participation Restrictions  Investment banker, operational;Yard Work;Other    PT Frequency  1x / week    PT Duration  6 weeks    PT Treatment/Interventions  ADLs/Self Care Home Management;Electrical Stimulation;Moist Heat;Iontophoresis 4mg /ml Dexamethasone;Functional mobility training;Therapeutic activities;Therapeutic exercise;Neuromuscular re-education;Manual techniques;Patient/family education;Passive range of motion;Dry needling;Joint Manipulations;Spinal Manipulations;Vasopneumatic Device;Taping    PT Next Visit Plan  Posterior rotator cuff strengthening for functional reach.  IR strteching for behind the back function.    PT Home Exercise Plan  See education.    Consulted and Agree with Plan of Care  Patient       Patient will benefit from skilled therapeutic intervention in order to improve the following deficits and impairments:  Decreased range of motion, Impaired UE functional use, Pain, Decreased activity tolerance, Decreased strength, Decreased mobility  Visit Diagnosis: Muscle weakness (generalized)  Chronic left shoulder pain     Problem List Patient Active Problem List   Diagnosis Date Noted  . Food allergy 11/10/2017  . Perennial allergic rhinitis 11/10/2017  . Tendonitis of upper biceps tendon of left shoulder 11/10/2017  . 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 Effusion of knee joint right 12/20/2013  . Primary osteoarthritis of right knee 12/20/2013  . Osteoarthritis of right knee 03/02/2013  . Knee pain, bilateral 03/02/2013  . LOWER LEG, ARTHRITIS, DEGEN./OSTEO 08/19/2007    Farley Ly PT, MPT 09/29/2019, 3:32 PM  [facility] Physical Therapy [address] [place], [place], [address] Phone: [phone]   Fax:  [phone]  Name: Henry Wolfe MRN: [id number] Date of Birth: Jul 24, 1960

## 2019-10-04 ENCOUNTER — Ambulatory Visit (INDEPENDENT_AMBULATORY_CARE_PROVIDER_SITE_OTHER): Payer: BC Managed Care – PPO | Admitting: Orthopedic Surgery

## 2019-10-04 ENCOUNTER — Encounter: Payer: Self-pay | Admitting: Orthopedic Surgery

## 2019-10-04 ENCOUNTER — Other Ambulatory Visit: Payer: Self-pay

## 2019-10-04 VITALS — BP 149/94 | HR 89 | Ht 69.0 in | Wt 260.0 lb

## 2019-10-04 DIAGNOSIS — M25561 Pain in right knee: Secondary | ICD-10-CM

## 2019-10-04 DIAGNOSIS — M25461 Effusion, right knee: Secondary | ICD-10-CM

## 2019-10-04 DIAGNOSIS — G8929 Other chronic pain: Secondary | ICD-10-CM

## 2019-10-04 MED ORDER — HYDROCODONE-ACETAMINOPHEN 5-325 MG PO TABS: 1.0000 | ORAL_TABLET | Freq: Every day | ORAL | 0 refills | Status: DC | PRN

## 2019-10-04 NOTE — Progress Notes (Signed)
Chief Complaint  Patient presents with  . Knee Pain    right knee wants aspiration injection     Henry Wolfe comes in for aspiration of his right knee he has recurrent effusions.  He has osteoarthritis.  He recently had a left shoulder procedure by Dr. August Saucer in [place].  He was put on tramadol for pain he says is causing swelling.  His shoulder does not seem to be very symptomatic.  He takes hydrocodone chronically for his knee and has so for several years due to his inability to proceed with surgery because he could not miss work  He would like that refilled  Procedure note injection aspiration right knee joint  Verbal consent was obtained to aspirate and inject the right knee timeout was completed to confirm the site of aspiration and injection  An 18-gauge needle was used to aspirate the knee approximately 40 cc of yellow fluid which was then injected with 40 mg of Depo-Medrol and 1% lidocaine 3 cc.  We used alcohol and ethyl chloride to prep the knee and then aspiration no complications were noted  Medication was changed  Meds ordered this encounter  Medications  . HYDROcodone-acetaminophen (NORCO/VICODIN) 5-325 MG tablet    Sig: Take 1 tablet by mouth daily as needed for moderate pain.    Dispense:  30 tablet    Refill:  0

## 2019-10-06 ENCOUNTER — Encounter: Payer: BC Managed Care – PPO | Admitting: Physical Therapy

## 2019-10-06 ENCOUNTER — Telehealth: Payer: Self-pay | Admitting: Physical Therapy

## 2019-10-06 NOTE — Telephone Encounter (Signed)
I called pt to follow up after a missed PT visit on 10/06/2019 at 10:15 am. I left his upcoming appointment time on his voice mail which is next Wednesday 10/13/2019 at 11:00 am.   Narda Amber, PT, MPT 10/06/19 10:54 AM

## 2019-10-13 ENCOUNTER — Ambulatory Visit (INDEPENDENT_AMBULATORY_CARE_PROVIDER_SITE_OTHER): Payer: BC Managed Care – PPO | Admitting: Physical Therapy

## 2019-10-13 ENCOUNTER — Other Ambulatory Visit: Payer: Self-pay

## 2019-10-13 DIAGNOSIS — M6281 Muscle weakness (generalized): Secondary | ICD-10-CM | POA: Diagnosis not present

## 2019-10-13 DIAGNOSIS — M25512 Pain in left shoulder: Secondary | ICD-10-CM | POA: Diagnosis not present

## 2019-10-13 DIAGNOSIS — G8929 Other chronic pain: Secondary | ICD-10-CM

## 2019-10-13 NOTE — Therapy (Addendum)
[facility] Physical Therapy [address] [place], [place], [address] Phone: [phone]   Fax:  [phone]  Physical Therapy Treatment/Discharge  Patient Details  Name: Henry Wolfe MRN: [id number] Date of Birth: [birth year] Referring Provider (PT): Dr. Marlou Sa   Encounter Date: 10/13/2019  PT End of Session - 10/13/19 1102    Visit Number  11    Number of Visits  24    Date for PT Re-Evaluation  10/28/19    PT Start Time  1059    PT Stop Time  1142    PT Time Calculation (min)  43 min    Activity Tolerance  Patient tolerated treatment well    Behavior During Therapy  [facility] for tasks assessed/performed       Past Medical History:  Diagnosis Date  . Arthritis    bil knees, fingers bil  . Left rotator cuff tear     Past Surgical History:  Procedure Laterality Date  . ALLOGRAFT APPLICATION Left 0/01/6760   Procedure: ALLOGRAFT APPLICATION left shoulder;  Surgeon: Meredith Pel, MD;  Location: [place];  Service: Orthopedics;  Laterality: Left;  . KNEE ARTHROSCOPY    . SHOULDER ARTHROSCOPY WITH SUBACROMIAL DECOMPRESSION, ROTATOR CUFF REPAIR AND BICEP TENDON REPAIR Left 06/08/2019   Procedure: LEFT SHOULDER LOWER TRAPEZIUS TRANSFER WITH ALLOGRAFT, ARTHROSCOPY, BICEPS TENODESIS,  OPEN SUBSCAPULAR REPAIR;  Surgeon: Meredith Pel, MD;  Location: [facility];  Service: Orthopedics;  Laterality: Left;    There were no vitals filed for this visit.  Subjective Assessment - 10/13/19 1101    Subjective  Henry Wolfe reports 2/10 pain upon arrival. Pt still reporting reaching across and reaching behind his back.    Currently in Pain?  Yes    Pain Score  2     Pain Location  Shoulder    Pain Orientation  Left    Pain Descriptors / Indicators  Aching;Sore    Pain Type  Surgical pain    Pain Onset  More than a month ago                        [facility] Adult PT Treatment/Exercise - 10/13/19 0001      Exercises    Exercises  Shoulder      Shoulder Exercises: Seated   Other Seated Exercises  serratus punches using 2# wand with 5# weight attached x 20 reps      Shoulder Exercises: Sidelying   External Rotation  AROM;Left;20 reps      Shoulder Exercises: Standing   Internal Rotation  AROM;10 reps   10 seconds thumb up the back stretch   Internal Rotation Limitations  using towel    Row  Strengthening;20 reps   TRX for functional pulling   Diagonals  Strengthening;Left;15 reps;Theraband    Theraband Level (Shoulder Diagonals)  Level 4 (Blue)    Diagonals Limitations  D1 and D2      Shoulder Exercises: ROM/Strengthening   UBE (Upper Arm Bike)  LVL 4  8 minutes  (:30 forward & back)    Wall Pushups Limitations  TRX push ups standing x 15 reps      Shoulder Exercises: Body Blade   Flexion  30 seconds;2 reps    Flexion Limitations  holding blade in both horizontal and vertical positions               PT Short Term Goals - 09/16/19 1050      PT SHORT TERM  GOAL #1   Title  Patient will demonstrate independent use of home exercise program to maintain progress from in clinic treatments.    Baseline  09/16/19: patient reports daily compliance with HEP and full understanding of program (delayed assessment of STGs due to multiple no shows)    Time  2    Period  Weeks    Status  Achieved    Target Date  08/11/19      PT SHORT TERM GOAL #2   Title  Pt will report at least 25% reduction in R UE radicular sxs to assist with work duties    Baseline  09/16/19: patient denies any upper extremity tingling in either upper extremity when performing work related duties he is currently working on (he is working a modified work schedule)    Time  3    Period  Weeks    Status  On-going      PT [place] #3   Title  Pt will report ability to lift L UE overhead with </= 3/10 L shoulder pain referral (along supraspinatus/lateral Deltoid referral) to perform iADLs    Period  Weeks    Status   Achieved        PT Long Term Goals - 10/13/19 1103      PT LONG TERM GOAL #1   Title  Patient will demonstrate/report pain at worst less than or equal to 2/10 to facilitate minimal limitation in daily activity secondary to pain symptoms.    Baseline  0%    Time  12    Period  Weeks    Status  On-going      PT LONG TERM GOAL #2   Title  Patient will demonstrate independent use of home exercise program to facilitate ability to maintain/progress functional gains from skilled physical therapy services.    Baseline  65 lbs    Time  12    Period  Weeks    Status  On-going      PT LONG TERM GOAL #3   Title  Patient will demonstrate return to work/recreational activity at previous level of function without limitations secondary due to condition    Baseline  3+/5, 7/10    Time  12    Period  Weeks    Status  On-going      PT LONG TERM GOAL #4   Title  Pt. will demonstrate L shoulder AROM WFL s symptoms to facilitate usual daily self care, reaching, functional activity at PLOF.    Baseline  10-15 min    Time  12    Period  Weeks    Status  On-going      PT LONG TERM GOAL #5   Title  Pt. will demonstrate LUE MMT 5/5 throughout to facilitate usual lifting, carrying for daily and work activity at [employer].    Time  12    Period  Weeks    Status  On-going            Plan - 10/13/19 1140    Clinical Impression Statement  Henry Wolfe is tolerating exericses well. Pt progressing with IR and ER stretching and mobility. Continue to progress with AROM and functional strengthening.    Personal Factors and Comorbidities  Profession    Examination-Activity Limitations  Bathing;Reach Overhead;Carry;Dressing;Hygiene/Grooming;Lift    Examination-Participation Restrictions  Investment banker, operational;Yard Work;Other    Stability/Clinical Decision Making  Stable/Uncomplicated    Rehab Potential  Good    PT Frequency  1x /  week    PT Duration  6 weeks    PT Treatment/Interventions   ADLs/Self Care Home Management;Electrical Stimulation;Moist Heat;Iontophoresis 65m/ml Dexamethasone;Functional mobility training;Therapeutic activities;Therapeutic exercise;Neuromuscular re-education;Manual techniques;Patient/family education;Passive range of motion;Dry needling;Joint Manipulations;Spinal Manipulations;Vasopneumatic Device;Taping    PT Next Visit Plan  Posterior rotator cuff strengthening for functional reach.  IR strteching for behind the back function.    PT Home Exercise Plan  See education.    Consulted and Agree with Plan of Care  Patient       Patient will benefit from skilled therapeutic intervention in order to improve the following deficits and impairments:  Decreased range of motion, Impaired UE functional use, Pain, Decreased activity tolerance, Decreased strength, Decreased mobility  Visit Diagnosis: Muscle weakness (generalized)  Chronic left shoulder pain     Problem List Patient Active Problem List   Diagnosis Date Noted  . Food allergy 11/10/2017  . Perennial allergic rhinitis 11/10/2017  . Tendonitis of upper biceps tendon of left shoulder 11/10/2017  . 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 Effusion of knee joint right 12/20/2013  . Primary osteoarthritis of right knee 12/20/2013  . Osteoarthritis of right knee 03/02/2013  . Knee pain, bilateral 03/02/2013  . LOWER LEG, ARTHRITIS, DEGEN./OSTEO 08/19/2007    JOretha Caprice PT, MPT 10/13/2019, 11:44 AM   PHYSICAL THERAPY DISCHARGE SUMMARY  Visits from Start of Care: 11  Current functional level related to goals / functional outcomes: See note   Remaining deficits: See note   Education / Equipment: HEP Plan: Patient agrees to discharge.  Patient goals were partially met. Patient is being discharged due to not returning since the last visit.  ?????     MScot Jun PT, DPT, OCS, ATC 01/13/20  8:55 AM    [facility] [address]  [address]Phone: [phone]  Fax:  [phone] Name: EBRINLEY TREANORMRN: [id number] of Birth: [birth year]

## 2019-10-18 ENCOUNTER — Other Ambulatory Visit: Payer: Self-pay

## 2019-10-18 MED ORDER — HYDROCODONE-ACETAMINOPHEN 5-325 MG PO TABS: 1.0000 | ORAL_TABLET | Freq: Every day | ORAL | 0 refills | Status: DC | PRN

## 2019-10-18 NOTE — Telephone Encounter (Signed)
Hydrocodone-Acetaminophen 5/325 mg  Qty 30 Tablets      [id number] - [place], [place] - [address]  [address], [place] [address]

## 2019-10-20 ENCOUNTER — Encounter: Payer: BC Managed Care – PPO | Admitting: Physical Therapy

## 2019-11-03 ENCOUNTER — Encounter: Payer: BC Managed Care – PPO | Admitting: Rehabilitative and Restorative Service Providers"

## 2019-11-03 ENCOUNTER — Telehealth: Payer: Self-pay | Admitting: Rehabilitative and Restorative Service Providers"

## 2019-11-03 NOTE — Telephone Encounter (Signed)
Called Pt. About appointment no show today.  Pt. Indicated he was not aware of today's appointment.    Pt. Instructed to call front desk for future scheduling.   Chyrel Masson, PT, DPT, OCS, ATC 11/03/19  10:39 AM

## 2019-11-30 ENCOUNTER — Other Ambulatory Visit: Payer: Self-pay | Admitting: Orthopedic Surgery

## 2019-11-30 MED ORDER — HYDROCODONE-ACETAMINOPHEN 5-325 MG PO TABS: 1.0000 | ORAL_TABLET | Freq: Every day | ORAL | 0 refills | Status: DC | PRN

## 2019-11-30 NOTE — Telephone Encounter (Signed)
Patient requests refill on Hydrocodone/Acetaminophen 5-325  Mgs.  Qty  30  Sig: Take 1 tablet by mouth daily as needed for moderate pain.  Patient states he uses Therapist, occupational on [address] [facility] in [place]

## 2019-12-14 ENCOUNTER — Ambulatory Visit: Payer: BC Managed Care – PPO | Admitting: Orthopedic Surgery

## 2019-12-21 IMAGING — MR MR SHOULDER*L* W/O CM
4 of 5 series · 20 of 40 positions shown · non-contrast
Comparison: None.

CLINICAL DATA: Left shoulder pain, limited range of motion.

EXAM:
MRI OF THE LEFT SHOULDER WITHOUT CONTRAST
TECHNIQUE: Multiplanar, multisequence MR imaging of the shoulder was performed.
No intravenous contrast was administered.

[Series 9: PD fat-sat · axial · left · 4.0mm · 0.44mm/px · z∈[-68,+14]mm · 8 of 20 slices shown (1 of 2)]
[im 1/20]
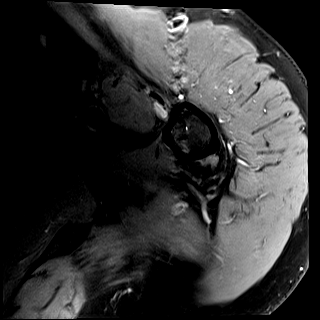
[im 3/20]
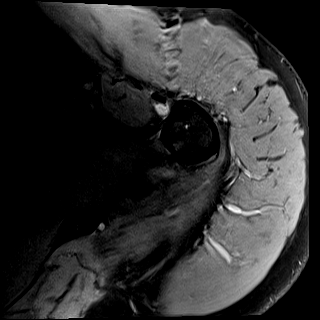
[im 6/20]
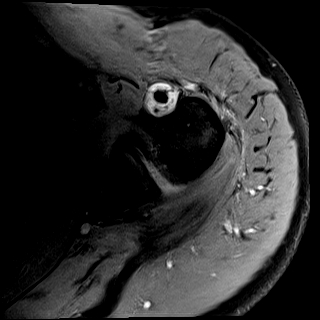
[im 9/20]
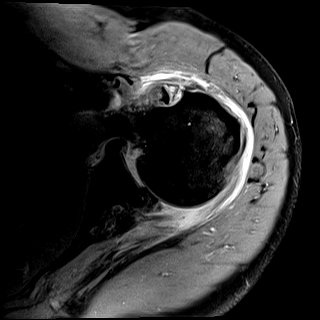
[im 11/20]
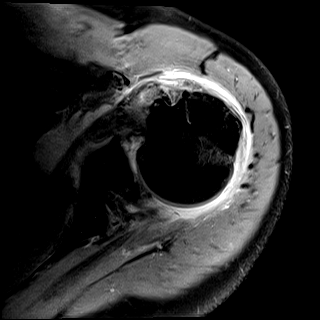
[im 14/20]
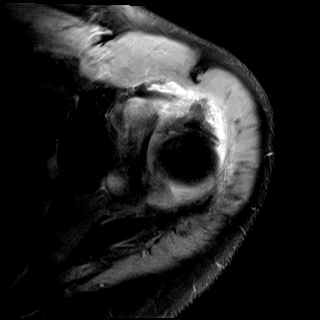
[im 17/20]
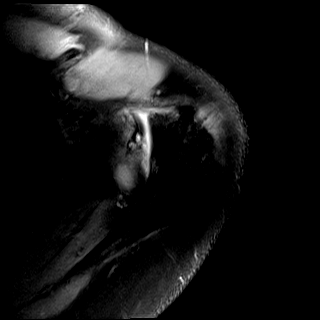
[im 20/20]
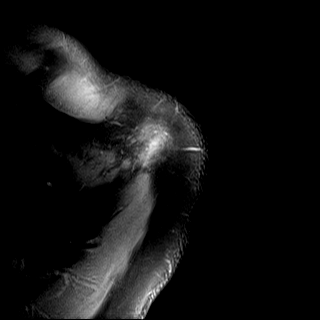

[Series 10: T2 fat-sat · oblique · left · 4.0mm · 0.23mm/px · 3 of 18 slices shown (1 of 2)]
[im 3/18]
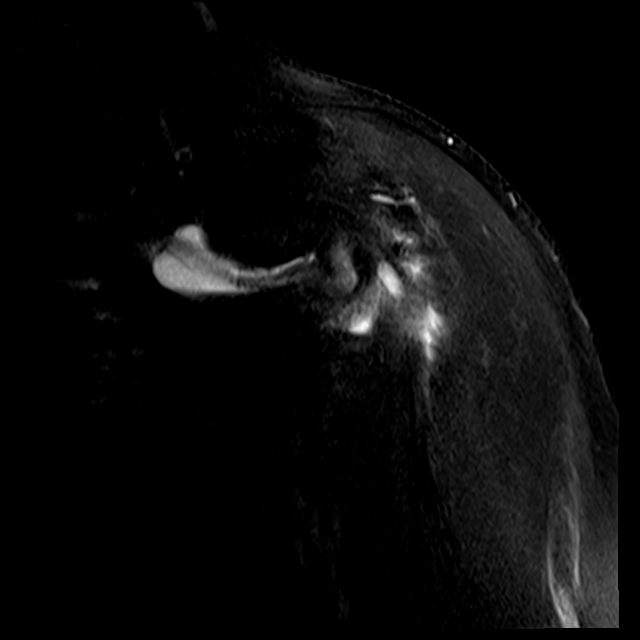
[im 10/18]
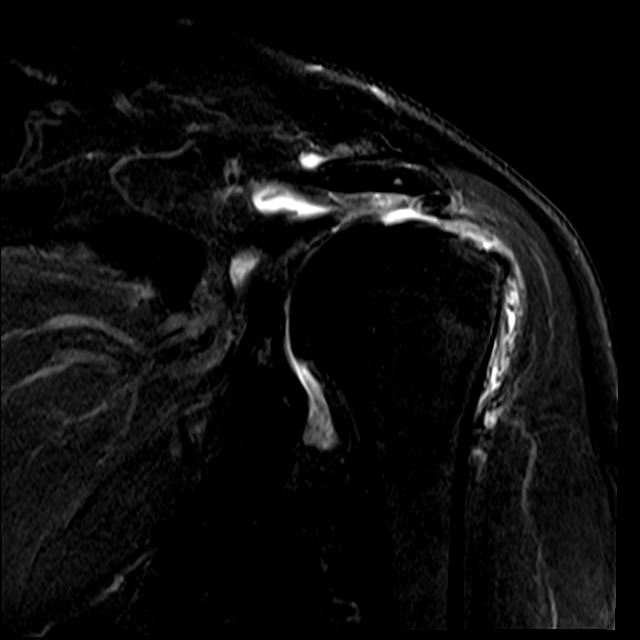
[im 15/18]
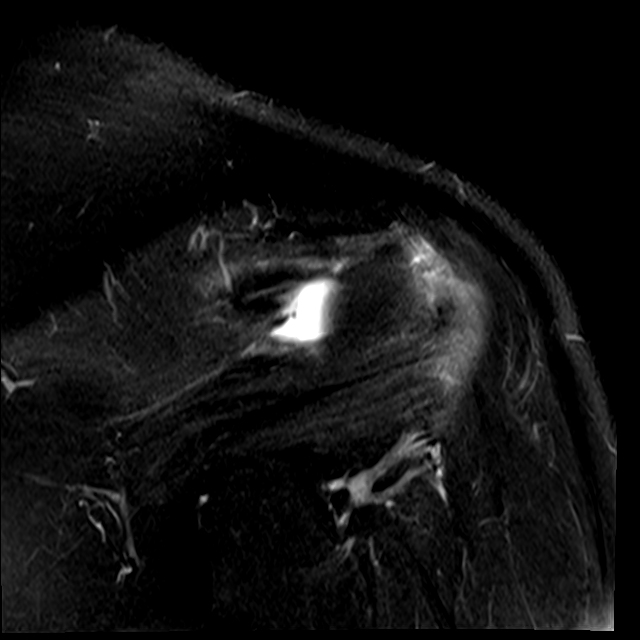

[Series 11: PD fat-sat · oblique · left · 4.0mm · 0.23mm/px · 6 of 18 slices shown (2 of 2)]
[im 1/18]
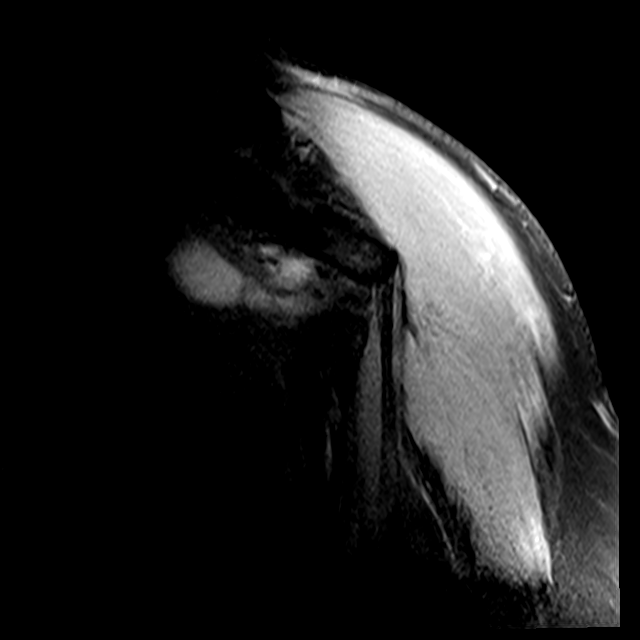
[im 3/18]
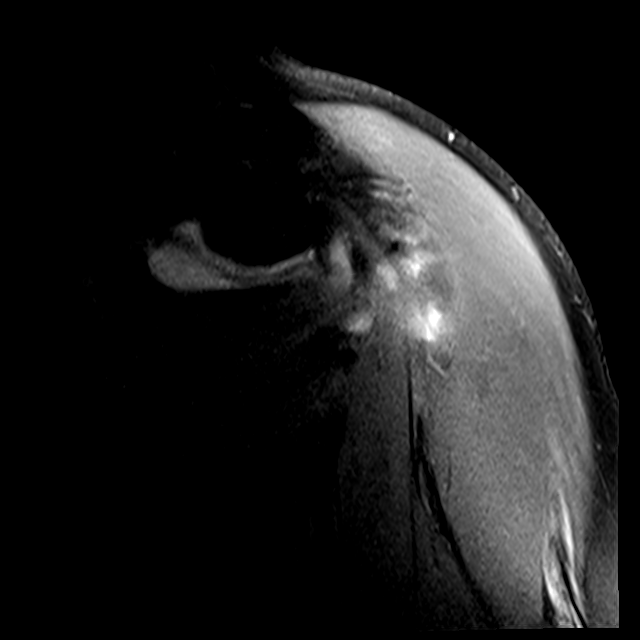
[im 5/18]
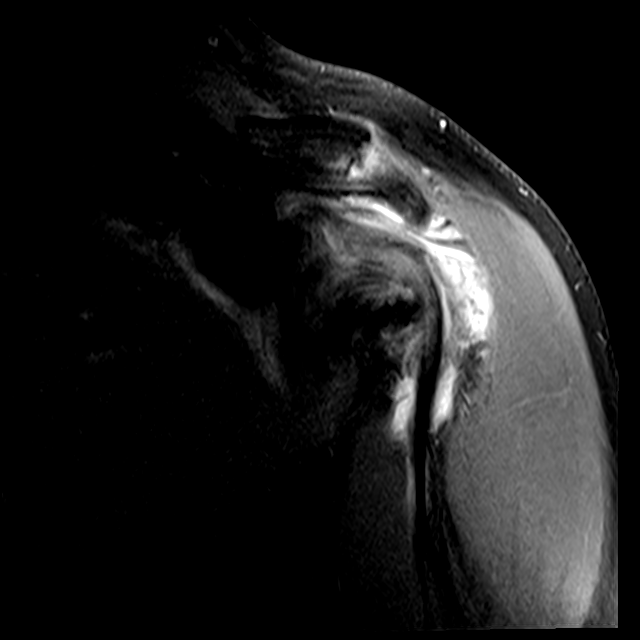
[im 8/18]
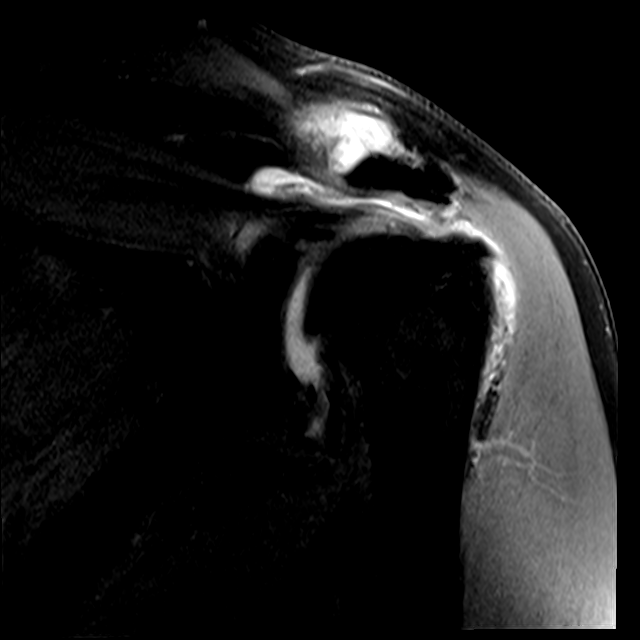
[im 10/18]
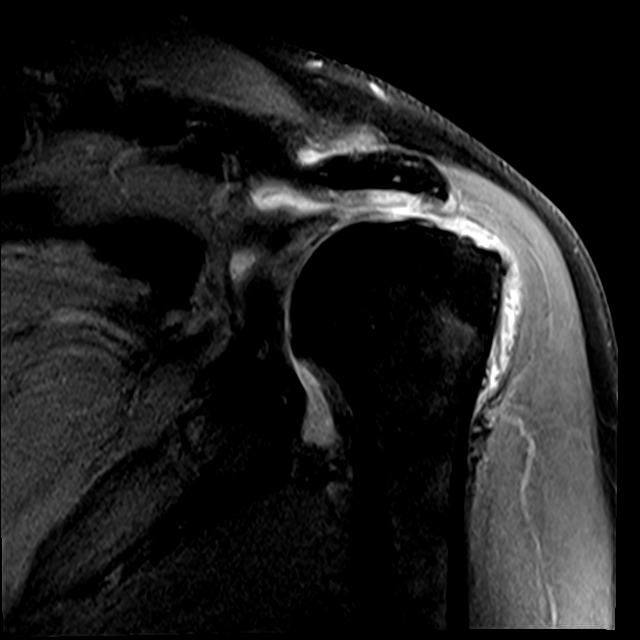
[im 15/18]
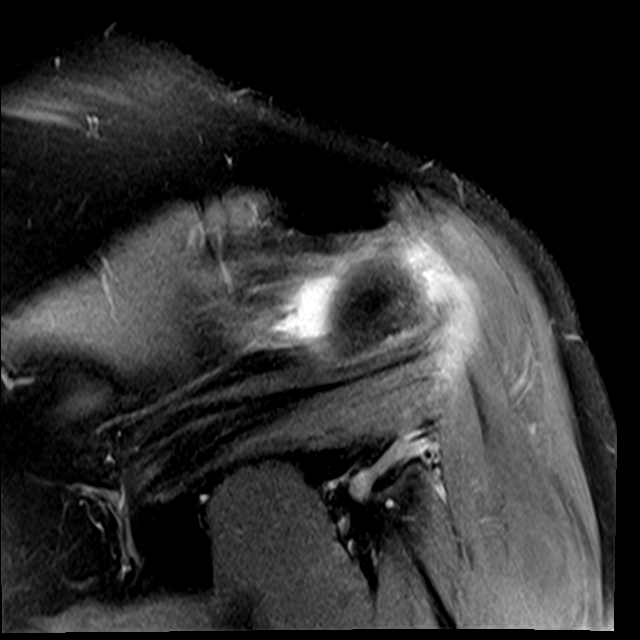

[Series 12: T2 fat-sat · oblique · left · 4.0mm · 0.47mm/px · 3 of 20 slices shown (2 of 2)]
[im 3/20]
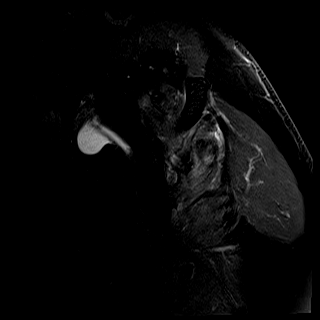
[im 11/20]
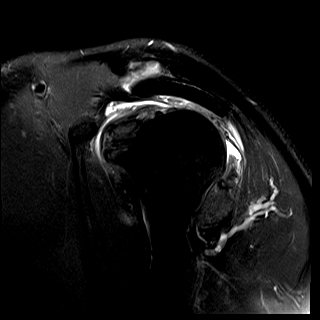
[im 17/20]
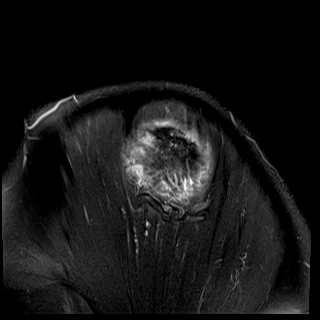

[20 of 40 positions shown; findings below may reference images not displayed]

FINDINGS: Rotator cuff: Complete tear of the supraspinatus and infraspinatus
tendons with 4 cm of maximum retraction. Teres minor tendon is
intact. Severe tendinosis of the subscapularis tendon with a
high-grade partial-thickness tear.

Muscles: Mild muscle atrophy of the teres minor muscle as can be
seen with quadrilateral space syndrome. Remainder the rotator cuff
muscles demonstrate no significant atrophy. There is mild edema in
the infraspinatus muscle which may reflect muscle strain. Remainder
of the muscles around the shoulder demonstrate no focal abnormality.

Biceps long head: Severe tendinosis of the intra-articular portion
of the long head of the biceps tendon with a small partial tear of
the proximal extra-articular portion of the long head of the biceps
tendon.

Acromioclavicular Joint: Severe arthropathy of the acromioclavicular
joint. Type II acromion. Small amount of subacromial/subdeltoid
bursal fluid.

Glenohumeral Joint: Small joint effusion. Mild synovitis in the
axillary recess. Partial-thickness cartilage loss of the
glenohumeral joint.

Labrum: Limited evaluation of the labrum secondary lack of
intra-articular fluid. Severe posterior labral degeneration with a
possible degenerative tear.

Bones:  No acute osseous abnormality.  No aggressive osseous lesion.

Other: No fluid collection or hematoma.
IMPRESSION: 1. Complete tear of the supraspinatus and infraspinatus tendons with
4 cm of maximum retraction.
2. Severe tendinosis of the subscapularis tendon with a high-grade
partial-thickness tear.
3. Severe tendinosis of the intra-articular portion of the long head
of the biceps tendon with a small partial tear of the proximal
extra-articular portion of the long head of the biceps tendon.
4. Severe arthropathy of the acromioclavicular joint.
5. Mild muscle atrophy of the teres minor muscle as can be seen with
quadrilateral space syndrome.
6. Small joint effusion with mild synovitis in the axillary recess.
Partial-thickness cartilage loss of the glenohumeral joint.

## 2020-01-03 ENCOUNTER — Other Ambulatory Visit: Payer: Self-pay | Admitting: Orthopedic Surgery

## 2020-01-03 MED ORDER — HYDROCODONE-ACETAMINOPHEN 5-325 MG PO TABS: 1.0000 | ORAL_TABLET | Freq: Every day | ORAL | 0 refills | Status: DC | PRN

## 2020-01-03 NOTE — Telephone Encounter (Signed)
Patient requests refill on Hydrocodone/Acetaminophen 5-325  Ms  Kathryne Sharper: Take 1 tablet by mouth daily as needed for moderate pain.       Patient states [facility] uses [facility] [phone] - [place], [place] - [address]

## 2020-01-06 ENCOUNTER — Ambulatory Visit (INDEPENDENT_AMBULATORY_CARE_PROVIDER_SITE_OTHER): Payer: BC Managed Care – PPO | Admitting: Orthopedic Surgery

## 2020-01-06 ENCOUNTER — Other Ambulatory Visit: Payer: Self-pay

## 2020-01-06 ENCOUNTER — Encounter: Payer: Self-pay | Admitting: Orthopedic Surgery

## 2020-01-06 VITALS — Ht 69.0 in | Wt 258.0 lb

## 2020-01-06 DIAGNOSIS — G8929 Other chronic pain: Secondary | ICD-10-CM

## 2020-01-06 DIAGNOSIS — M25461 Effusion, right knee: Secondary | ICD-10-CM

## 2020-01-06 DIAGNOSIS — M25561 Pain in right knee: Secondary | ICD-10-CM | POA: Diagnosis not present

## 2020-01-06 MED ORDER — HYDROCODONE-ACETAMINOPHEN 5-325 MG PO TABS: 1.0000 | ORAL_TABLET | ORAL | 0 refills | Status: AC | PRN

## 2020-01-06 NOTE — Progress Notes (Signed)
Henry Wolfe  01/06/2020  Body mass index is 38.1 kg/m.   S:  Chief Complaint  Patient presents with   Knee Pain    Rt knee pain f/u   Pain and swelling right knee.  Patient wants to talk about his medication prescriptions seems to be running out too soon    O: Ht 5\' 9"  (1.753 m)    Wt 258 lb (117 kg)    BMI 38.10 kg/m   Physical Exam  Large effusion right knee is flexion arc is 110 degrees he has a slight flexion contracture he has tenderness around the joint   MEDICAL DECISION MAKING  Encounter Diagnoses  Name Primary?   Effusion of knee joint right Yes   Chronic pain of right knee    MANAGEMENT   Procedure note injection and aspiration right knee joint  Verbal consent was obtained to aspirate and inject the right knee joint   Timeout was completed to confirm the site of aspiration and injection  An 18-gauge needle was used to aspirate the knee joint from a suprapatellar lateral approach.  The medications used were 40 mg of Depo-Medrol and 1% lidocaine 3 cc  Anesthesia was provided by ethyl chloride and the skin was prepped with alcohol.  After cleaning the skin with alcohol an 18-gauge needle was used to aspirate the right knee joint.  We obtained 22  cc of fluid clear yellow   We follow this by injection of 40 mg of Depo-Medrol and 3 cc 1% lidocaine.  There were no complications. A sterile bandage was applied.    Meds ordered this encounter  Medications   HYDROcodone-acetaminophen (NORCO/VICODIN) 5-325 MG tablet    Sig: Take 1 tablet by mouth every 4 (four) hours as needed for up to 5 days for moderate pain.    Dispense:  30 tablet    Refill:  0      , MD  01/06/2020 4:21 PM

## 2020-01-07 ENCOUNTER — Telehealth: Payer: Self-pay | Admitting: Radiology

## 2020-01-07 DIAGNOSIS — Z6838 Body mass index (BMI) 38.0-38.9, adult: Secondary | ICD-10-CM

## 2020-01-07 DIAGNOSIS — M25461 Effusion, right knee: Secondary | ICD-10-CM

## 2020-01-07 NOTE — Telephone Encounter (Signed)
-----   Message from Vickki Hearing, MD sent at 01/06/2020  4:22 PM EDT ----- Nutrition referral

## 2020-02-02 ENCOUNTER — Other Ambulatory Visit: Payer: Self-pay | Admitting: Orthopedic Surgery

## 2020-02-02 NOTE — Telephone Encounter (Signed)
Patient requests refill: HYDROcodone-acetaminophen (NORCO/VICODIN) 5-325 MG tablet 30 tablet  -[facility] at [address] AT [address], [facility]

## 2020-02-03 MED ORDER — HYDROCODONE-ACETAMINOPHEN 5-325 MG PO TABS: 1.0000 | ORAL_TABLET | Freq: Four times a day (QID) | ORAL | 0 refills | Status: DC | PRN

## 2020-02-22 ENCOUNTER — Ambulatory Visit: Payer: BC Managed Care – PPO | Admitting: Dietician

## 2020-03-06 ENCOUNTER — Other Ambulatory Visit: Payer: Self-pay | Admitting: Orthopedic Surgery

## 2020-03-06 NOTE — Telephone Encounter (Signed)
Patient called and needs refill for   HYDROcodone-Acetaminophen (Tab) NORCO/VICODIN 5-325 MG Take 1 tablet by mouth every 6 (six) hours as needed for moderate pain.    [facility] [place] [address]

## 2020-03-07 MED ORDER — HYDROCODONE-ACETAMINOPHEN 5-325 MG PO TABS: 1.0000 | ORAL_TABLET | Freq: Four times a day (QID) | ORAL | 0 refills | Status: DC | PRN

## 2020-03-30 ENCOUNTER — Encounter: Payer: Self-pay | Admitting: Dietician

## 2020-03-30 ENCOUNTER — Encounter: Payer: BC Managed Care – PPO | Attending: Orthopedic Surgery | Admitting: Dietician

## 2020-03-30 ENCOUNTER — Other Ambulatory Visit: Payer: Self-pay

## 2020-03-30 DIAGNOSIS — E669 Obesity, unspecified: Secondary | ICD-10-CM | POA: Diagnosis present

## 2020-03-30 NOTE — Patient Instructions (Signed)
·   Aim to eat at least 3 times per day. For breakfast, could be a snack size or something liquid such as a protein shake or smoothie.

## 2020-03-30 NOTE — Progress Notes (Signed)
Medical Nutrition Therapy   Primary concerns today: weight management  Referral diagnosis: E66.9- obesity Preferred learning style: no preference indicated Learning readiness: ready   NUTRITION ASSESSMENT   Clinical Medical Hx: osteoarthritis (knee), food allergy (alphagal) Medications: see chart Notable Signs/Symptoms: knee pain  Lifestyle & Dietary Hx Patient lives with his wife. States he has 10 children and 20 grandchildren, whom he sees often and cooks/prepares meals for them. 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 Typical meal pattern is 1 meal per day, dinner when his wife gets home from work. States he drinks lots of water/flavored water throughout the day. May snack on chips after dinner if anything. States he sleeps okay at night, but is used to not sleeping from being up with his young kids through the night. Usually goes to bed around 9:00pm and wakes up around 3:00am. States he does not get tired/sleepy throughout the day. States he noticed weight gain in the past when he had surgery and had to lay down ~6 weeks for recovery.   Estimated daily fluid intake: 64+ oz Sleep: sleeps okay, ~6 hours/night Stress / self-care: low stress Current average weekly physical activity: walking and activity as able  24-Hr Dietary Recall First Meal: - Snack: - Second Meal: - Snack: - Third Meal: meat + veggie + bread (or pasta salad + protein)  Snack: chips Beverages: water, sugar-free flavored water   NUTRITION DIAGNOSIS  Overweight/obesity (Fowler-3.3) related to decreased physical activity as evidenced by referral for weight management and questions from patient regarding healthy eating/ lifestyle for weight management.    NUTRITION INTERVENTION  Nutrition education (E-1) on the following topics:  . General balanced eating  Handouts Provided Include   MyPlate  Breakfast Ideas   Snack Ideas   Learning Style & Readiness for Change Teaching method utilized: Visual &  Auditory  Demonstrated degree of understanding via: Teach Back  Barriers to learning/adherence to lifestyle change: None Identified   Goals Established by Pt . Aim to eat at least 3 times per day. For breakfast, could be a snack size or something liquid such as a protein shake or smoothie.    MONITORING & EVALUATION Dietary intake, weekly physical activity, and goals in 1 month.  Next Steps  Patient is to return to [facility] for follow up visit in about 1 month.

## 2020-04-04 ENCOUNTER — Other Ambulatory Visit: Payer: Self-pay | Admitting: Orthopedic Surgery

## 2020-04-04 NOTE — Telephone Encounter (Signed)
Patient request refill   HYDROcodone-Acetaminophen (Tab) NORCO/VICODIN 5-325 MG Take 1 tablet by mouth every 6 (six) hours as needed for moderate pain.    Pharmacy [facility]

## 2020-04-05 MED ORDER — HYDROCODONE-ACETAMINOPHEN 5-325 MG PO TABS: 1.0000 | ORAL_TABLET | Freq: Four times a day (QID) | ORAL | 0 refills | Status: DC | PRN

## 2020-05-04 ENCOUNTER — Other Ambulatory Visit: Payer: Self-pay | Admitting: Orthopedic Surgery

## 2020-05-04 NOTE — Telephone Encounter (Signed)
Patient needs refill   HYDROcodone-Acetaminophen (Tab) NORCO/VICODIN 5-325 MG Take 1 tablet by mouth every 6 (six) hours as needed for moderate pain.      Pharmacy  [facility] [place]

## 2020-05-10 MED ORDER — HYDROCODONE-ACETAMINOPHEN 5-325 MG PO TABS: 1.0000 | ORAL_TABLET | Freq: Four times a day (QID) | ORAL | 0 refills | Status: DC | PRN

## 2020-05-10 NOTE — Telephone Encounter (Signed)
Rx refill request

## 2020-05-11 ENCOUNTER — Ambulatory Visit: Payer: BC Managed Care – PPO | Admitting: Dietician

## 2020-05-17 ENCOUNTER — Ambulatory Visit: Payer: BC Managed Care – PPO | Admitting: Orthopedic Surgery

## 2020-06-05 ENCOUNTER — Ambulatory Visit: Payer: BC Managed Care – PPO | Admitting: Orthopedic Surgery

## 2020-06-06 ENCOUNTER — Other Ambulatory Visit: Payer: Self-pay | Admitting: Orthopedic Surgery

## 2020-06-06 NOTE — Telephone Encounter (Signed)
Patient called to request refill (we cancelled his 06/05/20 appointment due to weather) HYDROcodone-acetaminophen (NORCO/VICODIN) 5-325 MG tablet 30 tablet  [facility], [place] ([address])

## 2020-06-15 ENCOUNTER — Ambulatory Visit (INDEPENDENT_AMBULATORY_CARE_PROVIDER_SITE_OTHER): Payer: BC Managed Care – PPO | Admitting: Orthopedic Surgery

## 2020-06-15 ENCOUNTER — Ambulatory Visit (INDEPENDENT_AMBULATORY_CARE_PROVIDER_SITE_OTHER): Payer: BC Managed Care – PPO

## 2020-06-15 ENCOUNTER — Encounter: Payer: Self-pay | Admitting: Orthopedic Surgery

## 2020-06-15 ENCOUNTER — Other Ambulatory Visit: Payer: Self-pay

## 2020-06-15 VITALS — BP 175/99 | HR 110 | Ht 69.0 in | Wt 270.0 lb

## 2020-06-15 DIAGNOSIS — M25511 Pain in right shoulder: Secondary | ICD-10-CM

## 2020-06-15 DIAGNOSIS — G8929 Other chronic pain: Secondary | ICD-10-CM

## 2020-06-15 DIAGNOSIS — M25561 Pain in right knee: Secondary | ICD-10-CM

## 2020-06-15 MED ORDER — HYDROCODONE-ACETAMINOPHEN 5-325 MG PO TABS: 1.0000 | ORAL_TABLET | Freq: Four times a day (QID) | ORAL | 0 refills | Status: DC | PRN

## 2020-06-15 NOTE — Progress Notes (Signed)
Chief Complaint  Patient presents with  . Knee Pain    Right knee  wants injection   . Shoulder Pain    Right/ wants xray     Henry Wolfe has chronic pain in his right knee with chronic swelling he has been delaying the surgery for several years coming in for aspiration injection  His right shoulder is hurting.  Complains of dull aching pain right shoulder.  He denies any weakness or loss of motion symptoms for the last month or so.  Examination of right shoulder shows no rotator cuff weakness positive impingement sign no loss of motion  X-ray shows osteoarthritis grade 2  Recommend subacromial injection right shoulder   Procedure note the subacromial injection shoulder RIGHT    Verbal consent was obtained to inject the  RIGHT   Shoulder  Timeout was completed to confirm the injection site is a subacromial space of the  RIGHT  shoulder   Medication used Depo-Medrol 40 mg and lidocaine 1% 3 cc  Anesthesia was provided by ethyl chloride  The injection was performed in the RIGHT  posterior subacromial space. After pinning the skin with alcohol and anesthetized the skin with ethyl chloride the subacromial space was injected using a 20-gauge needle. There were no complications  Sterile dressing was applied.    Right knee swollen.  His knee is in varus he has severe arthritis  Aspiration injection right knee  Procedure note injection and aspiration right knee joint  Verbal consent was obtained to aspirate and inject the right knee joint   Timeout was completed to confirm the site of aspiration and injection  An 18-gauge needle was used to aspirate the knee joint from a suprapatellar lateral approach.  The medications used were 40 mg of Depo-Medrol and 1% lidocaine 3 cc  Anesthesia was provided by ethyl chloride and the skin was prepped with alcohol.  After cleaning the skin with alcohol an 18-gauge needle was used to aspirate the right knee joint.  We obtained 10  cc of  fluid clear yellow fluid  We follow this by injection of 40 mg of Depo-Medrol and 3 cc 1% lidocaine.  There were no complications. A sterile bandage was applied.  Advised to continue his weight loss efforts  BP (!) 175/99   Pulse (!) 110   Ht 5\' 9"  (1.753 m)   Wt 270 lb (122.5 kg)   BMI 39.87 kg/m    Meds ordered this encounter  Medications  . HYDROcodone-acetaminophen (NORCO/VICODIN) 5-325 MG tablet    Sig: Take 1 tablet by mouth every 6 (six) hours as needed for moderate pain.    Dispense:  30 tablet    Refill:  0    Return as needed patient says he wants to try to do surgery this summer

## 2020-07-13 ENCOUNTER — Other Ambulatory Visit: Payer: Self-pay | Admitting: Orthopedic Surgery

## 2020-07-13 MED ORDER — HYDROCODONE-ACETAMINOPHEN 5-325 MG PO TABS: 1.0000 | ORAL_TABLET | Freq: Four times a day (QID) | ORAL | 0 refills | Status: DC | PRN

## 2020-07-13 NOTE — Telephone Encounter (Signed)
Patient requests prescription for Hydrocodone/Acetaminphen 5-325 mgs.  Qty  30   Sig: Take 1 tablet by mouth every 6 (six) hours as needed for moderate pain.  Patient uses [facility] in [place]

## 2020-07-28 ENCOUNTER — Ambulatory Visit (HOSPITAL_COMMUNITY)
Admission: EM | Admit: 2020-07-28 | Discharge: 2020-07-28 | Disposition: A | Discharge status: Home / Self Care | Payer: BC Managed Care – PPO | Attending: Emergency Medicine | Admitting: Emergency Medicine

## 2020-07-28 ENCOUNTER — Other Ambulatory Visit: Payer: Self-pay

## 2020-07-28 ENCOUNTER — Encounter (HOSPITAL_COMMUNITY): Payer: Self-pay

## 2020-07-28 DIAGNOSIS — R109 Unspecified abdominal pain: Secondary | ICD-10-CM | POA: Diagnosis not present

## 2020-07-28 DIAGNOSIS — M5442 Lumbago with sciatica, left side: Secondary | ICD-10-CM | POA: Diagnosis not present

## 2020-07-28 LAB — POCT URINALYSIS DIPSTICK, ED / UC
Glucose, UA: NEGATIVE mg/dL
Ketones, ur: >=160 mg/dL — AB
Leukocytes,Ua: NEGATIVE
Nitrite: NEGATIVE
Protein, ur: NEGATIVE mg/dL
Specific Gravity, Urine: >=1.03 (ref 1.005–1.030)
Urobilinogen, UA: 0.2 mg/dL (ref 0.0–1.0)
pH: 5.5 (ref 5.0–8.0)

## 2020-07-28 MED ORDER — CYCLOBENZAPRINE HCL 10 MG PO TABS: 10.0000 mg | ORAL_TABLET | Freq: Two times a day (BID) | ORAL | 0 refills | Status: DC | PRN

## 2020-07-28 MED ORDER — IBUPROFEN 800 MG PO TABS: 800.0000 mg | ORAL_TABLET | Freq: Three times a day (TID) | ORAL | 0 refills | Status: AC | PRN

## 2020-07-28 NOTE — Discharge Instructions (Signed)
Take ibuprofen as needed for discomfort.  Take the muscle relaxer as needed for muscle spasm; Do not drive, operate machinery, or drink alcohol with this medication as it can cause drowsiness.   Follow up with your primary care provider or an orthopedist if your symptoms are not improving.     

## 2020-07-28 NOTE — ED Triage Notes (Signed)
Pt presents with left side flank pain & back pain that radiates down left leg X 1 week .

## 2020-07-28 NOTE — ED Provider Notes (Signed)
[facility]    CSN: [id number] Arrival date & time: 07/28/20  1736      History   Chief Complaint Chief Complaint  Patient presents with  . Back Pain  . Flank Pain    HPI Henry Wolfe is a 60 y.o. male.   Patient presents with left lower back pain which radiates down his left leg x1 week.  No falls or injury.  He denies numbness, weakness, redness, bruising, wounds, abdominal pain, dysuria, or other symptoms.  Treatment attempted at home with hydrocodone and Advil.  His medical history includes arthritis, obesity.  The history is provided by the patient.    Past Medical History:  Diagnosis Date  . Arthritis    bil knees, fingers bil  . Left rotator cuff tear     Patient Active Problem List   Diagnosis Date Noted  . Obesity 03/30/2020  . Food allergy 11/10/2017  . Perennial allergic rhinitis 11/10/2017  . Tendonitis of upper biceps tendon of left shoulder 11/10/2017  . 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 Effusion of knee joint right 12/20/2013  . Primary osteoarthritis of right knee 12/20/2013  . Osteoarthritis of right knee 03/02/2013  . Knee pain, bilateral 03/02/2013  . LOWER LEG, ARTHRITIS, DEGEN./OSTEO 08/19/2007    Past Surgical History:  Procedure Laterality Date  . ALLOGRAFT APPLICATION Left 06/08/2019   Procedure: ALLOGRAFT APPLICATION left shoulder;  Surgeon: Cammy Copa, MD;  Location: [facility];  Service: Orthopedics;  Laterality: Left;  . KNEE ARTHROSCOPY    . SHOULDER ARTHROSCOPY WITH SUBACROMIAL DECOMPRESSION, ROTATOR CUFF REPAIR AND BICEP TENDON REPAIR Left 06/08/2019   Procedure: LEFT SHOULDER LOWER TRAPEZIUS TRANSFER WITH ALLOGRAFT, ARTHROSCOPY, BICEPS TENODESIS,  OPEN SUBSCAPULAR REPAIR;  Surgeon: Cammy Copa, MD;  Location: [facility];  Service: Orthopedics;  Laterality: Left;       Home Medications    Prior to Admission medications   Medication Sig Start Date End Date Taking?  Authorizing Provider  cyclobenzaprine (FLEXERIL) 10 MG tablet Take 1 tablet (10 mg total) by mouth 2 (two) times daily as needed for muscle spasms. 07/28/20  Yes Mickie Bail, NP  ibuprofen (ADVIL) 800 MG tablet Take 1 tablet (800 mg total) by mouth every 8 (eight) hours as needed. 07/28/20  Yes Mickie Bail, NP  HYDROcodone-acetaminophen (NORCO/VICODIN) 5-325 MG tablet Take 1 tablet by mouth every 6 (six) hours as needed for moderate pain. 07/13/20   Vickki Hearing, MD    Family History Family History  Family history unknown: Yes    Social History Social History   Tobacco Use  . Smoking status: Never Smoker  . Smokeless tobacco: Never Used  Substance Use Topics  . Alcohol use: Not Currently  . Drug use: No     Allergies   Patient has no known allergies.   Review of Systems Review of Systems  Constitutional: Negative for chills and fever.  HENT: Negative for ear pain and sore throat.   Eyes: Negative for pain and visual disturbance.  Respiratory: Negative for cough and shortness of breath.   Cardiovascular: Negative for chest pain and palpitations.  Gastrointestinal: Negative for abdominal pain and vomiting.  Genitourinary: Negative for dysuria and hematuria.  Musculoskeletal: Positive for back pain. Negative for gait problem.  Skin: Negative for color change and rash.  Neurological: Negative for syncope, weakness and numbness.  All other systems reviewed and are negative.    Physical Exam Triage Vital Signs ED Triage Vitals  Enc  Vitals Group     BP 07/28/20 1858 136/81     Pulse Rate 07/28/20 1858 94     Resp 07/28/20 1858 20     Temp 07/28/20 1858 98.4 F (36.9 C)     Temp Source 07/28/20 1858 Oral     SpO2 07/28/20 1858 99 %     Weight --      Height --      Head Circumference --      Peak Flow --      Pain Score 07/28/20 1856 10     Pain Loc --      Pain Edu? --      Excl. in GC? --    No data found.  Updated Vital Signs BP 136/81 (BP  Location: Right Arm)   Pulse 94   Temp 98.4 F (36.9 C) (Oral)   Resp 20   SpO2 99%   Visual Acuity Right Eye Distance:   Left Eye Distance:   Bilateral Distance:    Right Eye Near:   Left Eye Near:    Bilateral Near:     Physical Exam Vitals and nursing note reviewed.  Constitutional:      General: He is not in acute distress.    Appearance: He is well-developed and well-nourished. He is obese.  HENT:     Head: Normocephalic and atraumatic.     Mouth/Throat:     Mouth: Mucous membranes are moist.  Eyes:     Conjunctiva/sclera: Conjunctivae normal.  Cardiovascular:     Rate and Rhythm: Normal rate and regular rhythm.     Heart sounds: Normal heart sounds.  Pulmonary:     Effort: Pulmonary effort is normal. No respiratory distress.     Breath sounds: Normal breath sounds.  Abdominal:     Palpations: Abdomen is soft.     Tenderness: There is no abdominal tenderness.  Musculoskeletal:        General: No swelling, tenderness, deformity, signs of injury or edema. Normal range of motion.     Cervical back: Neck supple.  Skin:    General: Skin is warm and dry.     Findings: No bruising, erythema, lesion or rash.  Neurological:     General: No focal deficit present.     Mental Status: He is alert and oriented to person, place, and time.     Sensory: No sensory deficit.     Motor: No weakness.     Gait: Gait normal.     Comments: Negative straight leg raise.  Psychiatric:        Mood and Affect: Mood and affect and mood normal.        Behavior: Behavior normal.      UC Treatments / Results  Labs (all labs ordered are listed, but only abnormal results are displayed) Labs Reviewed  POCT URINALYSIS DIPSTICK, ED / UC - Abnormal; Notable for the following components:      Result Value   Bilirubin Urine SMALL (*)    Ketones, ur >=160 (*)    Hgb urine dipstick MODERATE (*)    All other components within normal limits    EKG   Radiology No results  found.  Procedures Procedures (including critical care time)  Medications Ordered in UC Medications - No data to display  Initial Impression / Assessment and Plan / UC Course  I have reviewed the triage vital signs and the nursing notes.  Pertinent labs & imaging results that were available during my  care of the patient were reviewed by me and considered in my medical decision making (see chart for details).   Acute left lower back pain with left-sided sciatica.  Treating with ibuprofen and Flexeril.  Precautions for drowsiness with Flexeril discussed.  Instructed patient to follow-up with his PCP or an orthopedist if his symptoms are not improving.  He agrees to plan of care.   Final Clinical Impressions(s) / UC Diagnoses   Final diagnoses:  Acute left-sided low back pain with left-sided sciatica     Discharge Instructions     Take ibuprofen as needed for discomfort.  Take the muscle relaxer as needed for muscle spasm; Do not drive, operate machinery, or drink alcohol with this medication as it can cause drowsiness.   Follow up with your primary care provider or an orthopedist if your symptoms are not improving.        ED Prescriptions    Medication Sig Dispense Auth. Provider   ibuprofen (ADVIL) 800 MG tablet Take 1 tablet (800 mg total) by mouth every 8 (eight) hours as needed. 21 tablet Mickie Bail, NP   cyclobenzaprine (FLEXERIL) 10 MG tablet Take 1 tablet (10 mg total) by mouth 2 (two) times daily as needed for muscle spasms. 20 tablet Mickie Bail, NP     I have reviewed the PDMP during this encounter.   Mickie Bail, NP 07/28/20 2010

## 2020-08-10 ENCOUNTER — Other Ambulatory Visit: Payer: Self-pay | Admitting: Orthopedic Surgery

## 2020-08-10 MED ORDER — HYDROCODONE-ACETAMINOPHEN 5-325 MG PO TABS: 1.0000 | ORAL_TABLET | Freq: Four times a day (QID) | ORAL | 0 refills | Status: DC | PRN

## 2020-08-10 NOTE — Telephone Encounter (Signed)
Patient requests refill on Hydrocodone/Acetminophen 5-325 mgs  Qty  30  Sig: Take 1 tablet by mouth every 6 (six) hours as needed for moderate pain.  Patient uses [facility] in [place]

## 2020-08-14 ENCOUNTER — Ambulatory Visit: Payer: BC Managed Care – PPO

## 2020-08-14 ENCOUNTER — Other Ambulatory Visit: Payer: Self-pay

## 2020-08-14 ENCOUNTER — Ambulatory Visit (INDEPENDENT_AMBULATORY_CARE_PROVIDER_SITE_OTHER): Payer: BC Managed Care – PPO | Admitting: Orthopedic Surgery

## 2020-08-14 ENCOUNTER — Encounter: Payer: Self-pay | Admitting: Orthopedic Surgery

## 2020-08-14 VITALS — BP 176/99 | HR 92 | Ht 68.0 in | Wt 275.1 lb

## 2020-08-14 DIAGNOSIS — M5442 Lumbago with sciatica, left side: Secondary | ICD-10-CM | POA: Diagnosis not present

## 2020-08-14 DIAGNOSIS — G8929 Other chronic pain: Secondary | ICD-10-CM

## 2020-08-14 DIAGNOSIS — M5440 Lumbago with sciatica, unspecified side: Secondary | ICD-10-CM

## 2020-08-14 MED ORDER — PREDNISONE 10 MG (48) PO TBPK: ORAL_TABLET | Freq: Every day | ORAL | 0 refills | Status: DC

## 2020-08-14 MED ORDER — METHOCARBAMOL 750 MG PO TABS: 750.0000 mg | ORAL_TABLET | Freq: Four times a day (QID) | ORAL | 2 refills | Status: DC

## 2020-08-14 MED ORDER — GABAPENTIN 300 MG PO CAPS: 300.0000 mg | ORAL_CAPSULE | Freq: Three times a day (TID) | ORAL | 5 refills | Status: DC

## 2020-08-14 NOTE — Addendum Note (Signed)
Addended by: Jodene Nam A on: 08/14/2020 10:13 AM   Modules accepted: Orders

## 2020-08-14 NOTE — Progress Notes (Signed)
Chief Complaint  Patient presents with  . Hip Pain    L/ started in low back but went down into hip and down leg. Its been 3 weeks now.    60 year old male presents with 3-week history of lower back pain and left leg pain radiating down to his left foot associated with numbness in the left lower extremity and severe back pain.  His symptoms began about 3 weeks ago he was carrying some groceries set him down felt a catch in his lower back had severe lower back pain which eventually progressed to left lower extremity radicular type symptoms  Patient went to urgent care was put on Flexeril and Advil  He takes chronic opioid therapy for chronic knee pain hydrocodone 5 mg every 6  Review of Systems  Constitutional: Negative for fever.  Respiratory: Negative for shortness of breath.   Cardiovascular: Negative for chest pain.  Skin: Negative.   Neurological: Positive for tingling and sensory change. Negative for focal weakness and weakness.   Past Medical History:  Diagnosis Date  . Arthritis    bil knees, fingers bil  . Left rotator cuff tear    Past Surgical History:  Procedure Laterality Date  . ALLOGRAFT APPLICATION Left 06/08/2019   Procedure: ALLOGRAFT APPLICATION left shoulder;  Surgeon: Cammy Copa, MD;  Location: [facility];  Service: Orthopedics;  Laterality: Left;  . KNEE ARTHROSCOPY    . SHOULDER ARTHROSCOPY WITH SUBACROMIAL DECOMPRESSION, ROTATOR CUFF REPAIR AND BICEP TENDON REPAIR Left 06/08/2019   Procedure: LEFT SHOULDER LOWER TRAPEZIUS TRANSFER WITH ALLOGRAFT, ARTHROSCOPY, BICEPS TENODESIS,  OPEN SUBSCAPULAR REPAIR;  Surgeon: Cammy Copa, MD;  Location: [facility];  Service: Orthopedics;  Laterality: Left;    BP (!) 176/99   Pulse 92   Ht 5\' 8"  (1.727 m)   Wt 275 lb 2 oz (124.8 kg)   BMI 41.83 kg/m   He is awake and alert he is oriented x3  Mood and affect is normal  General appearance mesomorphic body habitus  Exam  musculoskeletal  Altered gait limping favoring left lower extremity  Normal strength in both lower extremities  Alteration L4 sensory left leg decreased versus right normal remaining sensory pinwheel test normal  Positive straight leg raise left negative right  Midline and left side lower back tenderness as well  Evaluation of data  Urgent care data notes patient was seen on February 25 with similar complaints as stated no x-rays at that time Flexeril and ibuprofen and advised to follow-up if not improved

## 2020-08-14 NOTE — Patient Instructions (Addendum)
No work next 3 weeks   Go home apply heat to back 3 x a day   Take medication   Get MRI    Sciatica  Sciatica is pain, numbness, weakness, or tingling along the path of the sciatic nerve. The sciatic nerve starts in the lower back and runs down the back of each leg. The nerve controls the muscles in the lower leg and in the back of the knee. It also provides feeling (sensation) to the back of the thigh, the lower leg, and the sole of the foot. Sciatica is a symptom of another medical condition that pinches or puts pressure on the sciatic nerve. Sciatica most often only affects one side of the body. Sciatica usually goes away on its own or with treatment. In some cases, sciatica may come back (recur). What are the causes? This condition is caused by pressure on the sciatic nerve or pinching of the nerve. This may be the result of:  A disk in between the bones of the spine bulging out too far (herniated disk).  Age-related changes in the spinal disks.  A pain disorder that affects a muscle in the buttock.  Extra bone growth near the sciatic nerve.  A break (fracture) of the pelvis.  Pregnancy.  Tumor. This is rare. What increases the risk? The following factors may make you more likely to develop this condition:  Playing sports that place pressure or stress on the spine.  Having poor strength and flexibility.  A history of back injury or surgery.  Sitting for long periods of time.  Doing activities that involve repetitive bending or lifting.  Obesity. What are the signs or symptoms? Symptoms can vary from mild to very severe, and they may include:  Any of these problems in the lower back, leg, hip, or buttock: ? Mild tingling, numbness, or dull aches. ? Burning sensations. ? Sharp pains.  Numbness in the back of the calf or the sole of the foot.  Leg weakness.  Severe back pain that makes movement difficult. Symptoms may get worse when you cough, sneeze, or  laugh, or when you sit or stand for long periods of time. How is this diagnosed? This condition may be diagnosed based on:  Your symptoms and medical history.  A physical exam.  Blood tests.  Imaging tests, such as: ? X-rays. ? MRI. ? CT scan. How is this treated? In many cases, this condition improves on its own without treatment. However, treatment may include:  Reducing or modifying physical activity.  Exercising and stretching.  Icing and applying heat to the affected area.  Medicines that help to: ? Relieve pain and swelling. ? Relax your muscles.  Injections of medicines that help to relieve pain, irritation, and inflammation around the sciatic nerve (steroids).  Surgery. Follow these instructions at home: Medicines  Take over-the-counter and prescription medicines only as told by your health care provider.  Ask your health care provider if the medicine prescribed to you: ? Requires you to avoid driving or using heavy machinery. ? Can cause constipation. You may need to take these actions to prevent or treat constipation:  Drink enough fluid to keep your urine pale yellow.  Take over-the-counter or prescription medicines.  Eat foods that are high in fiber, such as beans, whole grains, and fresh fruits and vegetables.  Limit foods that are high in fat and processed sugars, such as fried or sweet foods. Managing pain  If directed, put ice on the affected area. ?  Put ice in a plastic bag. ? Place a towel between your skin and the bag. ? Leave the ice on for 20 minutes, 2-3 times a day.  If directed, apply heat to the affected area. Use the heat source that your health care provider recommends, such as a moist heat pack or a heating pad. ? Place a towel between your skin and the heat source. ? Leave the heat on for 20-30 minutes. ? Remove the heat if your skin turns bright red. This is especially important if you are unable to feel pain, heat, or cold. You  may have a greater risk of getting burned.      Activity  Return to your normal activities as told by your health care provider. Ask your health care provider what activities are safe for you.  Avoid activities that make your symptoms worse.  Take brief periods of rest throughout the day. ? When you rest for longer periods, mix in some mild activity or stretching between periods of rest. This will help to prevent stiffness and pain. ? Avoid sitting for long periods of time without moving. Get up and move around at least one time each hour.  Exercise and stretch regularly, as told by your health care provider.  Do not lift anything that is heavier than 10 lb (4.5 kg) while you have symptoms of sciatica. When you do not have symptoms, you should still avoid heavy lifting, especially repetitive heavy lifting.  When you lift objects, always use proper lifting technique, which includes: ? Bending your knees. ? Keeping the load close to your body. ? Avoiding twisting.   General instructions  Maintain a healthy weight. Excess weight puts extra stress on your back.  Wear supportive, comfortable shoes. Avoid wearing high heels.  Avoid sleeping on a mattress that is too soft or too hard. A mattress that is firm enough to support your back when you sleep may help to reduce your pain.  Keep all follow-up visits as told by your health care provider. This is important. Contact a health care provider if:  You have pain that: ? Wakes you up when you are sleeping. ? Gets worse when you lie down. ? Is worse than you have experienced in the past. ? Lasts longer than 4 weeks.  You have an unexplained weight loss. Get help right away if:  You are not able to control when you urinate or have bowel movements (incontinence).  You have: ? Weakness in your lower back, pelvis, buttocks, or legs that gets worse. ? Redness or swelling of your back. ? A burning sensation when you  urinate. Summary  Sciatica is pain, numbness, weakness, or tingling along the path of the sciatic nerve.  This condition is caused by pressure on the sciatic nerve or pinching of the nerve.  Sciatica can cause pain, numbness, or tingling in the lower back, legs, hips, and buttocks.  Treatment often includes rest, exercise, medicines, and applying ice or heat. This information is not intended to replace advice given to you by your health care provider. Make sure you discuss any questions you have with your health care provider. Document Revised: 06/08/2018 Document Reviewed: 06/08/2018 Elsevier Patient Education  2021 [employer] Inc[employer]  Herniated Disk  A herniated disk, also called a ruptured disk or slipped disk, occurs when a disk in the spine bulges out too far. Between the bones in the spine (vertebrae), there are oval disks that are made of a soft, spongy center filled with  liquid that is surrounded by a tough outer ring. The disks connect the vertebrae, help the spine move, and keep the bones from rubbing against each other when you move. When you have a herniated disk, the spongy center of the disk bulges out or breaks through the outer ring. The spongy center can press on a nerve between the vertebrae and cause pain. This can occur anywhere in the back or neck area, but the lower back is most commonly affected. What are the causes? This condition may be caused by:  Age-related wear and tear.  Sudden injury, such as a strain or sprain. What increases the risk? The following factors may make you more likely to develop this condition:  Aging. This is the main risk factor for a herniated disk.  Being a man who is 81-67 years old.  Frequently doing activities that involve heavy lifting, bending, or twisting.  Not getting enough exercise.  Being overweight.  Using tobacco products. What are the signs or symptoms? Symptoms may vary depending on where your herniated disk is  located.  A herniated disk in the lower back may cause: ? Sharp pain in part of the hips, buttocks, or legs. ? Sharp pain in the lower back, spreading down through the leg into the foot (sciatica).  A herniated disk in the neck may cause dizziness and vertigo. It may also cause pain or weakness in the neck, shoulder, or upper arm, forearm, or fingers. You may also have muscle weakness. You may have trouble:  Lifting your leg or arm.  Standing on your toes.  Squeezing tightly with one of your hands. Other symptoms may include:  Numbness or tingling in the affected areas of the hands, arms, feet, or legs.  Inability to control when you urinate or when you have bowel movements. This is a rare but serious sign of a severe herniated disk in the lower back. How is this diagnosed? This condition may be diagnosed based on:  Your symptoms.  Your medical history.  A physical exam. The exam may include: ? A straight-leg test. For this test, you will lie on your back while your health care provider lifts your leg, keeping your knee straight. If you feel pain, you likely have a herniated disk. ? Neurologic tests. These include checking for numbness, reflexes, muscle strength, and problems with posture.  Imaging tests, such as: ? X-rays. ? MRI. ? CT scan. ? Electromyogram (EMG) to check the nerves that control muscles. How is this treated? Treatment for this condition may include:  A period of light, painless activity for a few days to several weeks. Complete bed rest is not recommended. ? If you have a herniated disk in your lower back, avoid sitting as it increases pressure on the disk.  Medicines. These may include: ? NSAIDs, such as ibuprofen, to help reduce pain and swelling. ? Muscle relaxants to prevent sudden tightening of the back muscles (back spasms). ? Prescription pain medicines, if you have severe pain.  Ice or heat therapy.  Steroid injections in the area of the  herniated disk. These can help reduce pain and swelling.  Physical therapy to strengthen your back muscles. In many cases, symptoms go away with treatment over a period of days or weeks. You will most likely be free of symptoms after 3-4 months. If other treatments do not help to relieve your symptoms, you may need surgery. Follow these instructions at home: Medicines  Take over-the-counter and prescription medicines only as told by  your health care provider.  Ask your health care provider if the medicine prescribed to you: ? Requires you to avoid driving or using heavy machinery. ? Can cause constipation. You may need to take these actions to prevent or treat constipation:  Drink enough fluid to keep your urine pale yellow.  Take over-the-counter or prescription medicines.  Eat foods that are high in fiber, such as beans, whole grains, and fresh fruits and vegetables.  Limit foods that are high in fat and processed sugars, such as fried or sweet foods. Managing pain, stiffness, and swelling  If directed, put ice on the painful area. Icing can help to relieve pain. To do this: ? Put ice in a plastic bag. ? Place a towel between your skin and the bag. ? Leave the ice on for 20 minutes, 2-3 times a day. ? Remove the ice if your skin turns bright red. This is very important. If you cannot feel pain, heat, or cold, you have a greater risk of damage to the area.  If directed, apply heat to the painful area as often as told by your health care provider. Heat can reduce the stiffness of your muscles. Use the heat source that your health care provider recommends, such as a moist heat pack or a heating pad. ? Place a towel between your skin and the heat source. ? Leave the heat on for 20-30 minutes. ? Remove the heat if your skin turns bright red. This is especially important if you are unable to feel pain, heat, or cold. You may have a greater risk of getting burned.      Activity  Avoid  strict bed rest but only do activities that are painless.  Gradually return to your normal activities and exercise as told by your health care provider. Ask your health care provider what activities and exercises are safe for you.  Use good posture.  Avoid movements that cause pain.  Do not lift anything that is heavier than 10 lb (4.5 kg), or the limit that you are told, until your health care provider says that it is safe.  Do not sit or stand for long periods of time without changing positions.  Do not sit for long periods of time without getting up and moving around.  If physical therapy was prescribed, do exercises as told by your health care provider.  Aim to strengthen muscles in your back and abdomen with exercises such as swimming or walking. General instructions  Do not use any products that contain nicotine or tobacco, such as cigarettes, e-cigarettes, and chewing tobacco. These products can delay healing. If you need help quitting, ask your health care provider.  Do not wear high-heeled shoes.  Do not sleep on your abdomen.  If you are overweight, work with your health care provider to lose weight safely.  Keep all follow-up visits. This is important. How is this prevented?  Maintain a healthy weight.  Maintain physical fitness. Do at least 150 minutes of moderate-intensity exercise each week, such as brisk walking or water aerobics.  When lifting objects: ? Keep your feet at least shoulder-width apart and tighten the muscles of your abdomen. ? Keep your spine neutral as you bend your knees and hips. It is important to lift using the strength of your legs, not your back. Do not lock your knees straight out. ? Always ask for help to lift heavy or awkward objects. Contact a health care provider if you:  Have back pain or  neck pain that does not get better after 6 weeks.  Have severe pain in your back, neck, legs, or arms.  Develop numbness, tingling, or weakness  anywhere in your body. Get help right away if:  You cannot move your arms or legs.  You cannot control when you urinate or have bowel movements.  You feel dizzy or you faint.  You have shortness of breath. These symptoms may represent a serious problem that is an emergency. Do not wait to see if the symptoms will go away. Get medical help right away. Call your local emergency services (911 in the [place] Do not drive yourself to the hospital. Summary  A herniated disk, also called a ruptured disk or slipped disk, occurs when a disk in the spine bulges out too far (herniates).  This condition may be caused by age-related wear and tear or a sudden injury.  Symptoms may vary depending on where your herniated disk is located.  Treatment may include rest, medicines, ice or heat therapy, steroid injections, and physical therapy.  If other treatments do not help to relieve your symptoms, you may need surgery. This information is not intended to replace advice given to you by your health care provider. Make sure you discuss any questions you have with your health care provider. Document Revised: 09/08/2019 Document Reviewed: 09/08/2019 Elsevier Patient Education  2021 [employer].

## 2020-08-28 ENCOUNTER — Ambulatory Visit: Payer: BC Managed Care – PPO | Admitting: Orthopedic Surgery

## 2020-09-07 ENCOUNTER — Ambulatory Visit
Admission: RE | Admit: 2020-09-07 | Discharge: 2020-09-07 | Disposition: A | Discharge status: Home / Self Care | Payer: BC Managed Care – PPO | Source: Ambulatory Visit | Attending: Orthopedic Surgery | Admitting: Orthopedic Surgery

## 2020-09-07 DIAGNOSIS — M5442 Lumbago with sciatica, left side: Secondary | ICD-10-CM

## 2020-09-11 ENCOUNTER — Other Ambulatory Visit: Payer: Self-pay

## 2020-09-11 ENCOUNTER — Ambulatory Visit (INDEPENDENT_AMBULATORY_CARE_PROVIDER_SITE_OTHER): Payer: BC Managed Care – PPO | Admitting: Orthopedic Surgery

## 2020-09-11 ENCOUNTER — Encounter: Payer: Self-pay | Admitting: Orthopedic Surgery

## 2020-09-11 VITALS — BP 182/107 | HR 104 | Ht 68.0 in | Wt 279.0 lb

## 2020-09-11 DIAGNOSIS — M5442 Lumbago with sciatica, left side: Secondary | ICD-10-CM

## 2020-09-11 DIAGNOSIS — M5126 Other intervertebral disc displacement, lumbar region: Secondary | ICD-10-CM

## 2020-09-11 MED ORDER — HYDROCODONE-ACETAMINOPHEN 5-325 MG PO TABS: 1.0000 | ORAL_TABLET | Freq: Four times a day (QID) | ORAL | 0 refills | Status: DC | PRN

## 2020-09-11 MED ORDER — GABAPENTIN 300 MG PO CAPS: 300.0000 mg | ORAL_CAPSULE | Freq: Three times a day (TID) | ORAL | 5 refills | Status: DC

## 2020-09-11 NOTE — Patient Instructions (Signed)
You will get a call from [facility], if you want to call them and have not heard anything in about a week you can call them [phone]

## 2020-09-11 NOTE — Progress Notes (Signed)
Chief Complaint  Patient presents with  . Back Pain    Better with meds   . Results    Review lumbar MRI     BP (!) 182/107   Pulse (!) 104   Ht 5\' 8"  (1.727 m)   Wt 279 lb (126.6 kg)   BMI 42.42 kg/m   The patient meets the AMA guidelines for Morbid (severe) obesity with a BMI > 40.0 and I have recommended weight loss.  Henry Wolfe presented to [name] on March 14 with acute back pain left-sided radicular symptoms.  He was initially treated with Flexeril and Advil with no relief.  He is on chronic opiate therapy 5 mg of hydrocodone every 6 hours for chronic knee pain and arthritis, he is planning surgery  We put him on steroids gabapentin and continued his hydrocodone his left leg pain this much better than his back pain is almost resolved  However, his MRI shows a left-sided disc herniation L4 foraminal root impingement  4 weeks include patient  I recommend she neurosurgeon for ongoing management of his back problems  I will plan to see him again May the plan is knee surgery at the end of May provided to straighten the BMI down to 40 or less  He will have refill of his hydrocodone until May 1 and he will continue with gabapentin  Meds ordered this encounter  Medications  . gabapentin (NEURONTIN) 300 MG capsule    Sig: Take 1 capsule (300 mg total) by mouth 3 (three) times daily.    Dispense:  90 capsule    Refill:  5  . HYDROcodone-acetaminophen (NORCO/VICODIN) 5-325 MG tablet    Sig: Take 1 tablet by mouth every 6 (six) hours as needed for moderate pain.    Dispense:  30 tablet    Refill:  0   Code: rx management and pesonally read image

## 2020-09-14 DIAGNOSIS — Z6841 Body Mass Index (BMI) 40.0 and over, adult: Secondary | ICD-10-CM | POA: Insufficient documentation

## 2020-09-14 DIAGNOSIS — R030 Elevated blood-pressure reading, without diagnosis of hypertension: Secondary | ICD-10-CM | POA: Insufficient documentation

## 2020-09-18 DIAGNOSIS — M545 Low back pain, unspecified: Secondary | ICD-10-CM | POA: Insufficient documentation

## 2020-10-10 ENCOUNTER — Other Ambulatory Visit: Payer: Self-pay | Admitting: Orthopedic Surgery

## 2020-10-11 ENCOUNTER — Encounter: Payer: BC Managed Care – PPO | Admitting: Orthopedic Surgery

## 2020-10-11 MED ORDER — HYDROCODONE-ACETAMINOPHEN 5-325 MG PO TABS: 1.0000 | ORAL_TABLET | Freq: Four times a day (QID) | ORAL | 0 refills | Status: DC | PRN

## 2020-10-19 ENCOUNTER — Ambulatory Visit: Payer: BC Managed Care – PPO | Admitting: Orthopedic Surgery

## 2020-11-02 ENCOUNTER — Ambulatory Visit: Payer: BC Managed Care – PPO | Admitting: Orthopedic Surgery

## 2020-11-02 ENCOUNTER — Encounter: Payer: Self-pay | Admitting: Orthopedic Surgery

## 2020-11-28 ENCOUNTER — Other Ambulatory Visit: Payer: Self-pay | Admitting: Orthopedic Surgery

## 2020-11-28 NOTE — Telephone Encounter (Signed)
    Patient requests Hydrocodone/Acetaminophen 5-325 mgs.  Qty  30   Sig: Take 1 tablet by mouth every 6 (six) hours as needed for moderate pain.   Patient states he uses [facility] in [place] on [address].

## 2020-11-29 MED ORDER — HYDROCODONE-ACETAMINOPHEN 5-325 MG PO TABS: 1.0000 | ORAL_TABLET | Freq: Four times a day (QID) | ORAL | 0 refills | Status: DC | PRN

## 2020-12-07 ENCOUNTER — Ambulatory Visit (INDEPENDENT_AMBULATORY_CARE_PROVIDER_SITE_OTHER): Payer: BC Managed Care – PPO | Admitting: Orthopedic Surgery

## 2020-12-07 ENCOUNTER — Encounter: Payer: Self-pay | Admitting: Orthopedic Surgery

## 2020-12-07 ENCOUNTER — Other Ambulatory Visit: Payer: Self-pay

## 2020-12-07 VITALS — BP 171/103 | HR 100 | Ht 68.0 in | Wt 281.0 lb

## 2020-12-07 DIAGNOSIS — G8929 Other chronic pain: Secondary | ICD-10-CM | POA: Diagnosis not present

## 2020-12-07 DIAGNOSIS — M25461 Effusion, right knee: Secondary | ICD-10-CM | POA: Diagnosis not present

## 2020-12-07 DIAGNOSIS — M25561 Pain in right knee: Secondary | ICD-10-CM | POA: Diagnosis not present

## 2020-12-07 DIAGNOSIS — M5442 Lumbago with sciatica, left side: Secondary | ICD-10-CM | POA: Diagnosis not present

## 2020-12-07 MED ORDER — METHOCARBAMOL 750 MG PO TABS: 750.0000 mg | ORAL_TABLET | Freq: Four times a day (QID) | ORAL | 2 refills | Status: DC

## 2020-12-07 MED ORDER — HYDROCODONE-ACETAMINOPHEN 5-325 MG PO TABS: 1.0000 | ORAL_TABLET | Freq: Four times a day (QID) | ORAL | 0 refills | Status: DC | PRN

## 2020-12-07 NOTE — Progress Notes (Signed)
Henry Wolfe  12/07/2020  Body mass index is 42.73 kg/m.  ASSESSMENT AND PLAN:     Encounter Diagnoses  Name Primary?   Chronic pain of right knee Yes   Effusion of knee joint right    Acute midline low back pain with left-sided sciatica    Meds ordered this encounter  Medications   HYDROcodone-acetaminophen (NORCO/VICODIN) 5-325 MG tablet    Sig: Take 1 tablet by mouth every 6 (six) hours as needed for moderate pain.    Dispense:  30 tablet    Refill:  0   methocarbamol (ROBAXIN) 750 MG tablet    Sig: Take 1 tablet (750 mg total) by mouth 4 (four) times daily.    Dispense:  60 tablet    Refill:  66     60 year old male awaiting right total knee still trying to lose weight down to BMI of 42  Continue medication at this time aspiration injection right knee  Chief Complaint  Patient presents with   Knee Pain    Rt knee pain and swelling that has gotten worse for 1 wk.    Medication Refill    Hydrocodone, unsure of the name of other medication.    60 year old male chronic pain from arthritis right knee with recurrent effusions  BMI still over 40 awaiting total knee  Patient on a diet program but still drinking a lot of tea    No new trauma increased pain with swelling   HISTORY SECTION :   ROS  No evidence of skin rash or fever   has a past medical history of Arthritis and Left rotator cuff tear.   Past Surgical History:  Procedure Laterality Date   ALLOGRAFT APPLICATION Left 06/08/2019   Procedure: ALLOGRAFT APPLICATION left shoulder;  Surgeon: Cammy Copa, MD;  Location: [facility];  Service: Orthopedics;  Laterality: Left;   KNEE ARTHROSCOPY     SHOULDER ARTHROSCOPY WITH SUBACROMIAL DECOMPRESSION, ROTATOR CUFF REPAIR AND BICEP TENDON REPAIR Left 06/08/2019   Procedure: LEFT SHOULDER LOWER TRAPEZIUS TRANSFER WITH ALLOGRAFT, ARTHROSCOPY, BICEPS TENODESIS,  OPEN SUBSCAPULAR REPAIR;  Surgeon: Cammy Copa, MD;  Location: [facility];  Service: Orthopedics;  Laterality: Left;    Social History   Socioeconomic History   Marital status: Married    Spouse name: Not on file   Number of children: Not on file   Years of education: Not on file   Highest education level: Not on file  Occupational History   Not on file  Tobacco Use   Smoking status: Former    Pack years: 0.00    Types: Cigarettes   Smokeless tobacco: Never  Substance and Sexual Activity   Alcohol use: Not Currently   Drug use: No   Sexual activity: Not on file  Other Topics Concern   Not on file  Social History Narrative   Not on file   Social Determinants of Health   Financial Resource Strain: Not on file  Food Insecurity: Not on file  Transportation Needs: Not on file  Physical Activity: Not on file  Stress: Not on file  Social Connections: Not on file  Intimate Partner Violence: Not on file     Family History  Family history unknown: Yes      Allergies  Allergen Reactions   Beef Allergy Hives, Itching, Rash and Swelling     Current Outpatient Medications:    cyclobenzaprine (FLEXERIL) 10 MG tablet, Take 1 tablet (10 mg total) by mouth  2 (two) times daily as needed for muscle spasms., Disp: 20 tablet, Rfl: 0   gabapentin (NEURONTIN) 300 MG capsule, Take 1 capsule (300 mg total) by mouth 3 (three) times daily., Disp: 90 capsule, Rfl: 5   HYDROcodone-acetaminophen (NORCO/VICODIN) 5-325 MG tablet, Take 1 tablet by mouth every 6 (six) hours as needed for moderate pain., Disp: 30 tablet, Rfl: 0   ibuprofen (ADVIL) 800 MG tablet, Take 1 tablet (800 mg total) by mouth every 8 (eight) hours as needed., Disp: 21 tablet, Rfl: 0   methocarbamol (ROBAXIN) 750 MG tablet, Take 1 tablet (750 mg total) by mouth 4 (four) times daily., Disp: 60 tablet, Rfl: 2   PHYSICAL EXAM SECTION: BP (!) 171/103   Pulse 100   Ht 5\' 8"  (1.727 m)   Wt 281 lb (127.5 kg)   BMI 42.73 kg/m   Body mass index is 42.73 kg/m.   General  appearance: Well-developed well-nourished no gross deformities   Cardiovascular normal pulse and perfusion in all 4 extremities normal color without edema  Skin no lacerations or ulcerations no nodularity no palpable masses, no erythema or nodularity  Psychological: Awake alert and oriented x3 mood and affect normal  Musculoskeletal: Fairly large effusion right knee aspirated and injected   A steroid injection was performed at right knee superolateral approach 1% lidocaine 2 cc 6 mg of Depo-Medrol injected after aspiration of 30 cc of clear yellow fluid     2:54 PM

## 2021-01-18 ENCOUNTER — Other Ambulatory Visit: Payer: Self-pay | Admitting: Orthopedic Surgery

## 2021-01-18 NOTE — Telephone Encounter (Signed)
Patient called for refill: HYDROcodone-acetaminophen (NORCO/VICODIN) 5-325 MG tablet 30 tablet    [facility], [address], [place]

## 2021-01-29 MED ORDER — HYDROCODONE-ACETAMINOPHEN 5-325 MG PO TABS: 1.0000 | ORAL_TABLET | Freq: Four times a day (QID) | ORAL | 0 refills | Status: DC | PRN

## 2021-01-29 NOTE — Telephone Encounter (Signed)
Patient called via voice message this morning to relay that his [facility] on [address], [place], has closed. Please send refill to:  [facility] on [address], [place].

## 2021-03-19 ENCOUNTER — Other Ambulatory Visit: Payer: Self-pay | Admitting: Orthopedic Surgery

## 2021-03-19 DIAGNOSIS — M5442 Lumbago with sciatica, left side: Secondary | ICD-10-CM

## 2021-03-19 NOTE — Telephone Encounter (Signed)
Patient called and needs a refill on all 3 prescriptions.    HYDROcodone-acetaminophen (NORCO/VICODIN) 5-325 MG tablet  gabapentin (NEURONTIN) 300 MG capsule  methocarbamol (ROBAXIN) 750 MG tablet  [facility]  [address]

## 2021-03-20 ENCOUNTER — Other Ambulatory Visit: Payer: Self-pay | Admitting: Orthopedic Surgery

## 2021-03-20 DIAGNOSIS — M5442 Lumbago with sciatica, left side: Secondary | ICD-10-CM

## 2021-03-20 MED ORDER — HYDROCODONE-ACETAMINOPHEN 5-325 MG PO TABS: 1.0000 | ORAL_TABLET | Freq: Four times a day (QID) | ORAL | 0 refills | Status: DC | PRN

## 2021-03-20 MED ORDER — METHOCARBAMOL 750 MG PO TABS: 750.0000 mg | ORAL_TABLET | Freq: Four times a day (QID) | ORAL | 2 refills | Status: AC

## 2021-05-23 ENCOUNTER — Other Ambulatory Visit: Payer: Self-pay | Admitting: Orthopedic Surgery

## 2021-05-23 MED ORDER — HYDROCODONE-ACETAMINOPHEN 5-325 MG PO TABS: 1.0000 | ORAL_TABLET | Freq: Four times a day (QID) | ORAL | 0 refills | Status: DC | PRN

## 2021-05-23 NOTE — Telephone Encounter (Signed)
Patient called requesting refill for his pain medicine.     HYDROcodone-acetaminophen (NORCO/VICODIN) 5-325 MG tablet   Pharmacy :  [facility] [place] on [address]

## 2021-07-04 ENCOUNTER — Telehealth: Payer: Self-pay | Admitting: Orthopedic Surgery

## 2021-07-04 NOTE — Telephone Encounter (Signed)
Patient requests refill: HYDROcodone-acetaminophen (NORCO/VICODIN) 5-325 MG tablet 30 tablet       [facility] on [address], [place]* *pt also scheduled appointment for follow up visit

## 2021-07-05 ENCOUNTER — Other Ambulatory Visit: Payer: Self-pay | Admitting: Orthopedic Surgery

## 2021-07-05 MED ORDER — HYDROCODONE-ACETAMINOPHEN 5-325 MG PO TABS: 1.0000 | ORAL_TABLET | Freq: Four times a day (QID) | ORAL | 0 refills | Status: DC | PRN

## 2021-07-05 NOTE — Progress Notes (Signed)
?  Meds ordered this encounter  ?Medications  ? HYDROcodone-acetaminophen (NORCO/VICODIN) 5-325 MG tablet  ?  Sig: Take 1 tablet by mouth every 6 (six) hours as needed for moderate pain.  ?  Dispense:  30 tablet  ?  Refill:  0  ? ? ?

## 2021-07-19 ENCOUNTER — Ambulatory Visit: Payer: BC Managed Care – PPO | Admitting: Orthopedic Surgery

## 2021-07-26 ENCOUNTER — Ambulatory Visit: Payer: BC Managed Care – PPO | Admitting: Orthopedic Surgery

## 2021-09-05 ENCOUNTER — Other Ambulatory Visit: Payer: Self-pay | Admitting: Orthopedic Surgery

## 2021-09-05 NOTE — Telephone Encounter (Signed)
Patient called wants refill on his pain medicine  ? ?HYDROcodone-acetaminophen (NORCO/VICODIN) 5-325 MG tablet ? ? ?Pharmacy: [facility] on [address]  ?

## 2021-09-05 NOTE — Telephone Encounter (Signed)
Request sent to provider.

## 2021-09-06 MED ORDER — HYDROCODONE-ACETAMINOPHEN 5-325 MG PO TABS: 1.0000 | ORAL_TABLET | Freq: Four times a day (QID) | ORAL | 0 refills | Status: AC | PRN

## 2022-08-01 ENCOUNTER — Encounter: Payer: Self-pay | Admitting: Radiology

## 2024-04-01 ENCOUNTER — Encounter: Payer: Self-pay | Admitting: Orthopedic Surgery

## 2024-04-01 ENCOUNTER — Ambulatory Visit (INDEPENDENT_AMBULATORY_CARE_PROVIDER_SITE_OTHER): Payer: Self-pay | Admitting: Orthopedic Surgery

## 2024-04-01 ENCOUNTER — Ambulatory Visit: Payer: Self-pay

## 2024-04-01 ENCOUNTER — Ambulatory Visit (INDEPENDENT_AMBULATORY_CARE_PROVIDER_SITE_OTHER): Payer: Self-pay

## 2024-04-01 VITALS — Ht 68.0 in | Wt 245.0 lb

## 2024-04-01 DIAGNOSIS — M1712 Unilateral primary osteoarthritis, left knee: Secondary | ICD-10-CM

## 2024-04-01 DIAGNOSIS — H9319 Tinnitus, unspecified ear: Secondary | ICD-10-CM | POA: Insufficient documentation

## 2024-04-01 DIAGNOSIS — R0602 Shortness of breath: Secondary | ICD-10-CM | POA: Insufficient documentation

## 2024-04-01 DIAGNOSIS — M170 Bilateral primary osteoarthritis of knee: Secondary | ICD-10-CM

## 2024-04-01 DIAGNOSIS — G8929 Other chronic pain: Secondary | ICD-10-CM

## 2024-04-01 DIAGNOSIS — E291 Testicular hypofunction: Secondary | ICD-10-CM | POA: Insufficient documentation

## 2024-04-01 DIAGNOSIS — K5900 Constipation, unspecified: Secondary | ICD-10-CM | POA: Insufficient documentation

## 2024-04-01 DIAGNOSIS — Z87448 Personal history of other diseases of urinary system: Secondary | ICD-10-CM | POA: Insufficient documentation

## 2024-04-01 DIAGNOSIS — E785 Hyperlipidemia, unspecified: Secondary | ICD-10-CM | POA: Insufficient documentation

## 2024-04-01 DIAGNOSIS — D573 Sickle-cell trait: Secondary | ICD-10-CM | POA: Insufficient documentation

## 2024-04-01 DIAGNOSIS — M25461 Effusion, right knee: Secondary | ICD-10-CM

## 2024-04-01 DIAGNOSIS — M1711 Unilateral primary osteoarthritis, right knee: Secondary | ICD-10-CM

## 2024-04-01 DIAGNOSIS — K219 Gastro-esophageal reflux disease without esophagitis: Secondary | ICD-10-CM | POA: Insufficient documentation

## 2024-04-01 DIAGNOSIS — E559 Vitamin D deficiency, unspecified: Secondary | ICD-10-CM | POA: Insufficient documentation

## 2024-04-01 MED ORDER — METHYLPREDNISOLONE ACETATE 40 MG/ML IJ SUSP
40.0000 mg | Freq: Once | INTRAMUSCULAR | Status: AC
  Administered 2024-04-01: 40 mg via INTRA_ARTICULAR

## 2024-04-01 NOTE — Addendum Note (Signed)
 Addended by: VICENTA EMMIE HERO on: 04/01/2024 05:01 PM   Modules accepted: Orders

## 2024-04-01 NOTE — Progress Notes (Signed)
 Chief Complaint  Patient presents with   Knee Pain    R knee has had some swelling in the L recently as well   Assessment and plan  Encounter Diagnoses  Name Primary?   Bilateral chronic knee pain Yes   Primary osteoarthritis of right knee    Primary osteoarthritis of left knee    Bilateral knee effusions    63 year old male chronic knee pain chronic arthritis chronic effusions I recommended that he get the knee surgery done now but he wants to wait till January or later  Inject both knees after aspiration   Procedure :  Aspiration and injection  verbal consent was obtained to aspirate and inject the  right  knee joint   Timeout was completed to confirm the site of aspiration and injection   Medication:  40 mg of Depo-Medrol  and 1% lidocaine  3 cc  Anesthesia was provided by ethyl chloride and the skin was prepped with alcohol.  After cleaning the skin with alcohol an 18-gauge needle was used to aspirate the right knee joint.  Fluid obtained: 10  Color: Yellow  We followed this by injection of 40 mg of Depo-Medrol  and 3 cc 1% lidocaine .  There were no complications. A sterile bandage was applied.    Procedure note injection and aspiration left knee joint  Verbal consent was obtained to aspirate and inject the left knee joint   Timeout was completed to confirm the site of aspiration and injection  An 18-gauge needle was used to aspirate the left knee joint from a suprapatellar lateral approach.  The medications used were 40 mg of Depo-Medrol  and 1% lidocaine  3 cc  Anesthesia was provided by ethyl chloride and the skin was prepped with alcohol.  After cleaning the skin with alcohol an 18-gauge needle was used to aspirate the left knee joint.  We obtained 15 cc of fluid yellow  We followed this by injection of 40 mg of Depo-Medrol  and 3 cc 1% lidocaine .  There were no complications. A sterile bandage was applied.   Patient history: 63 year old male  longstanding osteoarthritis both knees severe disease grade 4 on x-ray previously seen treated with various nonoperative measures presents back with bilateral knee pain right worse than left associated with swelling.  He has a known history of multiple large effusions requiring aspiration   Body mass index is 37.25 kg/m. Ht 5' 8 (1.727 m)   Wt 245 lb (111.1 kg)   BMI 37.25 kg/m   Past Medical History:  Diagnosis Date   Arthritis    bil knees, fingers bil   Left rotator cuff tear    Allergies  Allergen Reactions   Beef Allergy Hives, Itching, Rash and Swelling   Physical Exam Vitals and nursing note reviewed. Exam conducted with a chaperone present.  Constitutional:      General: He is not in acute distress.    Appearance: Normal appearance. He is normal weight. He is not ill-appearing, toxic-appearing or diaphoretic.  HENT:     Head: Normocephalic and atraumatic.  Eyes:     General: No scleral icterus.       Right eye: No discharge.        Left eye: No discharge.     Extraocular Movements: Extraocular movements intact.     Pupils: Pupils are equal, round, and reactive to light.  Cardiovascular:     Rate and Rhythm: Normal rate.     Pulses: Normal pulses.  Pulmonary:     Effort: Pulmonary effort  is normal.  Skin:    General: Skin is warm and dry.     Capillary Refill: Capillary refill takes less than 2 seconds.  Neurological:     General: No focal deficit present.     Mental Status: He is alert and oriented to person, place, and time.     Sensory: No sensory deficit.     Gait: Gait abnormal.  Psychiatric:        Mood and Affect: Mood normal.     Right knee 5-1 15 range of motion varus alignment tenderness medial compartment extensor mechanism intact with 4+ effusion  Incision from the tibial osteotomy on the lateral portion of the leg  Left knee 3-125 degrees with 4+ effusion Medial compartment tenderness  Imaging DG Knee AP/LAT W/Sunrise Right Result Date:  04/01/2024 Imaging report right knee 3 views right knee Chronic right knee pain Grade 4 disease Kellgren-Lawrence Medial compartment noted joint space bone-on-bone contact Varus alignment to the knee all 3 compartments osteoarthritis Subchondral sclerosis cyst osteophytes throughout High riding patella Severe arthritis grade 4 right knee with varus deformity  DG Knee AP/LAT W/Sunrise Left Result Date: 04/01/2024 Imaging report left knee.  AP lateral sunrise left knee status post tibial osteotomy of the left knee There is a plate in the proximal tibia fixing the left tibial osteotomy he has bone-on-bone contact medially varus deformity effusion grade 4 arthritis with high riding patella and multiple osteophytes Grade 4 disease Kellgren-Lawrence Varus deformity Residual hardware from previous HTO

## 2024-04-01 NOTE — Progress Notes (Signed)
    04/01/2024   Chief Complaint  Patient presents with   Knee Pain    R knee has had some swelling in the L recently as well    No diagnosis found.  What pharmacy do you use ? [facility] [place]   Did you get better, worse or no change (Answer below)   Unchanged

## 2024-04-02 ENCOUNTER — Telehealth: Payer: Self-pay | Admitting: Orthopedic Surgery

## 2024-04-02 MED ORDER — SULINDAC 200 MG PO TABS: 200.0000 mg | ORAL_TABLET | Freq: Two times a day (BID) | ORAL | 0 refills | Status: DC

## 2024-04-02 NOTE — Telephone Encounter (Signed)
 Dr. Areatha pt - pt lvm stating he was seen yesterday and he was supposed to have medication sent to [facility] on [address] and it's not there.

## 2024-04-02 NOTE — Addendum Note (Signed)
 Addended by: MARGRETTE BARS E on: 04/02/2024 11:35 AM   Modules accepted: Orders

## 2024-04-05 NOTE — Telephone Encounter (Signed)
 I tried to call the pt back and advise, I get We're sorry, your call cannot be completed at this time

## 2024-04-13 ENCOUNTER — Telehealth: Payer: Self-pay

## 2024-04-13 NOTE — Telephone Encounter (Signed)
 Patient left message asking for his pain medication. Stated he was seen last week and his medicine was suppose to be sent to his pharmacy but it wasn't.  Hydrocodone -Acetaminophen  5/325 MG Tablet  PATIENT USES [facility] ON [address] IN [place]

## 2024-04-19 ENCOUNTER — Telehealth: Payer: Self-pay

## 2024-04-19 NOTE — Telephone Encounter (Signed)
 Patient left message wanting a Disability Placard application filled out for him by Dr. Margrette. He also asked for him pain medication again.  I called him last week and told him what Dr. Margrette said and I just called him to repeat what was said, but it just rang and rang.

## 2024-05-30 ENCOUNTER — Other Ambulatory Visit: Payer: Self-pay | Admitting: Orthopedic Surgery

## 2024-05-30 DIAGNOSIS — G8929 Other chronic pain: Secondary | ICD-10-CM

## 2024-05-30 DIAGNOSIS — M25461 Effusion, right knee: Secondary | ICD-10-CM

## 2024-05-30 DIAGNOSIS — M1711 Unilateral primary osteoarthritis, right knee: Secondary | ICD-10-CM

## 2024-05-30 DIAGNOSIS — M1712 Unilateral primary osteoarthritis, left knee: Secondary | ICD-10-CM
